# Patient Record
Sex: Male | Born: 1935 | Race: White | Hispanic: No | Marital: Married | Smoking: Former smoker
Health system: Southern US, Community
[De-identification: ages and names within clinical notes are randomized; demographics above are authoritative.]

## PROBLEM LIST (undated history)

## (undated) DIAGNOSIS — F339 Major depressive disorder, recurrent, unspecified: Secondary | ICD-10-CM

## (undated) DIAGNOSIS — N182 Chronic kidney disease, stage 2 (mild): Secondary | ICD-10-CM

## (undated) DIAGNOSIS — G43909 Migraine, unspecified, not intractable, without status migrainosus: Secondary | ICD-10-CM

## (undated) DIAGNOSIS — R7303 Prediabetes: Secondary | ICD-10-CM

## (undated) DIAGNOSIS — F329 Major depressive disorder, single episode, unspecified: Secondary | ICD-10-CM

## (undated) DIAGNOSIS — G4700 Insomnia, unspecified: Secondary | ICD-10-CM

## (undated) DIAGNOSIS — K219 Gastro-esophageal reflux disease without esophagitis: Secondary | ICD-10-CM

## (undated) DIAGNOSIS — R0602 Shortness of breath: Secondary | ICD-10-CM

## (undated) DIAGNOSIS — F32A Depression, unspecified: Secondary | ICD-10-CM

## (undated) DIAGNOSIS — G4730 Sleep apnea, unspecified: Secondary | ICD-10-CM

## (undated) DIAGNOSIS — G5600 Carpal tunnel syndrome, unspecified upper limb: Secondary | ICD-10-CM

## (undated) DIAGNOSIS — E785 Hyperlipidemia, unspecified: Secondary | ICD-10-CM

## (undated) DIAGNOSIS — J45909 Unspecified asthma, uncomplicated: Secondary | ICD-10-CM

## (undated) DIAGNOSIS — I10 Essential (primary) hypertension: Secondary | ICD-10-CM

## (undated) DIAGNOSIS — Z8673 Personal history of transient ischemic attack (TIA), and cerebral infarction without residual deficits: Secondary | ICD-10-CM

## (undated) DIAGNOSIS — N400 Enlarged prostate without lower urinary tract symptoms: Secondary | ICD-10-CM

## (undated) HISTORY — DX: Carpal tunnel syndrome, unspecified upper limb: G56.00

## (undated) HISTORY — PX: REPLACEMENT TOTAL KNEE: SUR1224

## (undated) HISTORY — DX: Essential (primary) hypertension: I10

## (undated) HISTORY — DX: Gastro-esophageal reflux disease without esophagitis: K21.9

## (undated) HISTORY — DX: Prediabetes: R73.03

## (undated) HISTORY — DX: Benign prostatic hyperplasia without lower urinary tract symptoms: N40.0

## (undated) HISTORY — DX: Unspecified asthma, uncomplicated: J45.909

## (undated) HISTORY — DX: Hyperlipidemia, unspecified: E78.5

## (undated) HISTORY — PX: APPENDECTOMY: SHX54

## (undated) HISTORY — PX: ROTATOR CUFF REPAIR: SHX139

## (undated) HISTORY — DX: Shortness of breath: R06.02

## (undated) HISTORY — DX: Sleep apnea, unspecified: G47.30

## (undated) HISTORY — PX: OTHER SURGICAL HISTORY: SHX169

## (undated) HISTORY — DX: Personal history of transient ischemic attack (TIA), and cerebral infarction without residual deficits: Z86.73

## (undated) HISTORY — DX: Chronic kidney disease, stage 2 (mild): N18.2

## (undated) HISTORY — DX: Migraine, unspecified, not intractable, without status migrainosus: G43.909

## (undated) HISTORY — DX: Depression, unspecified: F32.A

## (undated) HISTORY — DX: Insomnia, unspecified: G47.00

## (undated) HISTORY — DX: Major depressive disorder, recurrent, unspecified: F33.9

## (undated) HISTORY — DX: Major depressive disorder, single episode, unspecified: F32.9

## (undated) HISTORY — PX: TOTAL KNEE ARTHROPLASTY: SHX125

---

## 1988-06-21 HISTORY — PX: HEMORROIDECTOMY: SUR656

## 2010-05-18 ENCOUNTER — Encounter
Admission: RE | Admit: 2010-05-18 | Discharge: 2010-06-11 | Payer: Self-pay | Source: Home / Self Care | Attending: Specialist | Admitting: Specialist

## 2012-07-18 ENCOUNTER — Ambulatory Visit: Payer: Medicare Other | Attending: Orthopedic Surgery | Admitting: Physical Therapy

## 2012-07-18 DIAGNOSIS — IMO0001 Reserved for inherently not codable concepts without codable children: Secondary | ICD-10-CM | POA: Insufficient documentation

## 2012-07-18 DIAGNOSIS — Z96659 Presence of unspecified artificial knee joint: Secondary | ICD-10-CM | POA: Insufficient documentation

## 2012-07-18 DIAGNOSIS — M25519 Pain in unspecified shoulder: Secondary | ICD-10-CM | POA: Insufficient documentation

## 2012-07-18 DIAGNOSIS — M25619 Stiffness of unspecified shoulder, not elsewhere classified: Secondary | ICD-10-CM | POA: Insufficient documentation

## 2012-07-20 ENCOUNTER — Ambulatory Visit: Payer: Medicare Other | Admitting: Physical Therapy

## 2012-07-24 ENCOUNTER — Ambulatory Visit: Payer: Medicare Other | Attending: Orthopedic Surgery | Admitting: Physical Therapy

## 2012-07-24 DIAGNOSIS — IMO0001 Reserved for inherently not codable concepts without codable children: Secondary | ICD-10-CM | POA: Insufficient documentation

## 2012-07-24 DIAGNOSIS — M25519 Pain in unspecified shoulder: Secondary | ICD-10-CM | POA: Insufficient documentation

## 2012-07-24 DIAGNOSIS — Z96659 Presence of unspecified artificial knee joint: Secondary | ICD-10-CM | POA: Insufficient documentation

## 2012-07-24 DIAGNOSIS — M25619 Stiffness of unspecified shoulder, not elsewhere classified: Secondary | ICD-10-CM | POA: Insufficient documentation

## 2012-07-28 ENCOUNTER — Ambulatory Visit: Payer: Medicare Other | Admitting: Physical Therapy

## 2012-08-01 ENCOUNTER — Ambulatory Visit: Payer: Medicare Other | Admitting: Physical Therapy

## 2012-08-02 ENCOUNTER — Ambulatory Visit: Payer: Medicare Other | Admitting: Physical Therapy

## 2012-08-03 ENCOUNTER — Ambulatory Visit: Payer: Medicare Other | Admitting: Physical Therapy

## 2012-08-04 ENCOUNTER — Ambulatory Visit: Payer: Medicare Other | Admitting: Physical Therapy

## 2012-08-08 ENCOUNTER — Ambulatory Visit: Payer: Medicare Other | Admitting: Physical Therapy

## 2012-08-10 ENCOUNTER — Ambulatory Visit: Payer: Medicare Other | Admitting: Physical Therapy

## 2012-08-14 ENCOUNTER — Ambulatory Visit: Payer: Medicare Other | Admitting: Physical Therapy

## 2012-08-15 ENCOUNTER — Ambulatory Visit: Payer: Medicare Other | Admitting: Physical Therapy

## 2012-08-17 ENCOUNTER — Ambulatory Visit: Payer: Medicare Other | Admitting: Physical Therapy

## 2012-08-22 ENCOUNTER — Ambulatory Visit: Payer: Medicare Other | Attending: Orthopedic Surgery | Admitting: Physical Therapy

## 2012-08-22 DIAGNOSIS — IMO0001 Reserved for inherently not codable concepts without codable children: Secondary | ICD-10-CM | POA: Insufficient documentation

## 2012-08-22 DIAGNOSIS — M25519 Pain in unspecified shoulder: Secondary | ICD-10-CM | POA: Insufficient documentation

## 2012-08-22 DIAGNOSIS — Z96659 Presence of unspecified artificial knee joint: Secondary | ICD-10-CM | POA: Insufficient documentation

## 2012-08-22 DIAGNOSIS — M25619 Stiffness of unspecified shoulder, not elsewhere classified: Secondary | ICD-10-CM | POA: Insufficient documentation

## 2012-08-24 ENCOUNTER — Ambulatory Visit: Payer: Medicare Other | Admitting: Physical Therapy

## 2012-08-29 ENCOUNTER — Ambulatory Visit: Payer: Medicare Other | Admitting: Physical Therapy

## 2012-09-01 ENCOUNTER — Ambulatory Visit: Payer: Medicare Other | Admitting: Physical Therapy

## 2012-09-05 ENCOUNTER — Ambulatory Visit: Payer: Medicare Other | Admitting: Physical Therapy

## 2012-09-08 ENCOUNTER — Ambulatory Visit: Payer: Medicare Other | Admitting: Physical Therapy

## 2012-09-12 ENCOUNTER — Ambulatory Visit: Payer: Medicare Other | Admitting: Physical Therapy

## 2012-09-14 ENCOUNTER — Ambulatory Visit: Payer: Medicare Other | Admitting: Physical Therapy

## 2012-10-23 HISTORY — PX: ESOPHAGOGASTRODUODENOSCOPY: SHX1529

## 2013-06-21 HISTORY — PX: NASAL SEPTUM SURGERY: SHX37

## 2015-04-29 DIAGNOSIS — Z0000 Encounter for general adult medical examination without abnormal findings: Secondary | ICD-10-CM | POA: Diagnosis not present

## 2015-04-29 DIAGNOSIS — E785 Hyperlipidemia, unspecified: Secondary | ICD-10-CM | POA: Diagnosis not present

## 2015-04-29 DIAGNOSIS — Z125 Encounter for screening for malignant neoplasm of prostate: Secondary | ICD-10-CM | POA: Diagnosis not present

## 2015-04-29 DIAGNOSIS — Z1211 Encounter for screening for malignant neoplasm of colon: Secondary | ICD-10-CM | POA: Diagnosis not present

## 2015-04-29 DIAGNOSIS — Z79899 Other long term (current) drug therapy: Secondary | ICD-10-CM | POA: Diagnosis not present

## 2015-04-29 DIAGNOSIS — N182 Chronic kidney disease, stage 2 (mild): Secondary | ICD-10-CM | POA: Diagnosis not present

## 2015-04-29 DIAGNOSIS — E559 Vitamin D deficiency, unspecified: Secondary | ICD-10-CM | POA: Diagnosis not present

## 2015-04-29 DIAGNOSIS — I1310 Hypertensive heart and chronic kidney disease without heart failure, with stage 1 through stage 4 chronic kidney disease, or unspecified chronic kidney disease: Secondary | ICD-10-CM | POA: Diagnosis not present

## 2015-04-29 DIAGNOSIS — R7309 Other abnormal glucose: Secondary | ICD-10-CM | POA: Diagnosis not present

## 2015-05-09 DIAGNOSIS — N182 Chronic kidney disease, stage 2 (mild): Secondary | ICD-10-CM | POA: Diagnosis not present

## 2015-05-09 DIAGNOSIS — E785 Hyperlipidemia, unspecified: Secondary | ICD-10-CM | POA: Diagnosis not present

## 2015-05-09 DIAGNOSIS — Z125 Encounter for screening for malignant neoplasm of prostate: Secondary | ICD-10-CM | POA: Diagnosis not present

## 2015-05-09 DIAGNOSIS — Z0000 Encounter for general adult medical examination without abnormal findings: Secondary | ICD-10-CM | POA: Diagnosis not present

## 2015-05-09 DIAGNOSIS — Z136 Encounter for screening for cardiovascular disorders: Secondary | ICD-10-CM | POA: Diagnosis not present

## 2015-05-09 DIAGNOSIS — E6609 Other obesity due to excess calories: Secondary | ICD-10-CM | POA: Diagnosis not present

## 2015-05-09 DIAGNOSIS — R0602 Shortness of breath: Secondary | ICD-10-CM | POA: Diagnosis not present

## 2015-05-09 DIAGNOSIS — I1310 Hypertensive heart and chronic kidney disease without heart failure, with stage 1 through stage 4 chronic kidney disease, or unspecified chronic kidney disease: Secondary | ICD-10-CM | POA: Diagnosis not present

## 2015-05-09 DIAGNOSIS — G4700 Insomnia, unspecified: Secondary | ICD-10-CM | POA: Diagnosis not present

## 2015-05-09 DIAGNOSIS — Z1211 Encounter for screening for malignant neoplasm of colon: Secondary | ICD-10-CM | POA: Diagnosis not present

## 2015-05-13 DIAGNOSIS — Z1211 Encounter for screening for malignant neoplasm of colon: Secondary | ICD-10-CM | POA: Diagnosis not present

## 2015-07-03 DIAGNOSIS — H04123 Dry eye syndrome of bilateral lacrimal glands: Secondary | ICD-10-CM | POA: Diagnosis not present

## 2015-07-03 DIAGNOSIS — H2510 Age-related nuclear cataract, unspecified eye: Secondary | ICD-10-CM | POA: Diagnosis not present

## 2015-07-03 DIAGNOSIS — H5203 Hypermetropia, bilateral: Secondary | ICD-10-CM | POA: Diagnosis not present

## 2015-07-10 DIAGNOSIS — K921 Melena: Secondary | ICD-10-CM | POA: Diagnosis not present

## 2015-08-04 DIAGNOSIS — K921 Melena: Secondary | ICD-10-CM | POA: Diagnosis not present

## 2015-08-04 DIAGNOSIS — K644 Residual hemorrhoidal skin tags: Secondary | ICD-10-CM | POA: Diagnosis not present

## 2015-08-04 DIAGNOSIS — K648 Other hemorrhoids: Secondary | ICD-10-CM | POA: Diagnosis not present

## 2015-08-04 DIAGNOSIS — G4700 Insomnia, unspecified: Secondary | ICD-10-CM | POA: Diagnosis not present

## 2015-08-04 DIAGNOSIS — E785 Hyperlipidemia, unspecified: Secondary | ICD-10-CM | POA: Diagnosis not present

## 2015-08-04 DIAGNOSIS — K219 Gastro-esophageal reflux disease without esophagitis: Secondary | ICD-10-CM | POA: Diagnosis not present

## 2015-08-04 DIAGNOSIS — K5730 Diverticulosis of large intestine without perforation or abscess without bleeding: Secondary | ICD-10-CM | POA: Diagnosis not present

## 2015-08-04 DIAGNOSIS — J449 Chronic obstructive pulmonary disease, unspecified: Secondary | ICD-10-CM | POA: Diagnosis not present

## 2015-08-04 DIAGNOSIS — N400 Enlarged prostate without lower urinary tract symptoms: Secondary | ICD-10-CM | POA: Diagnosis not present

## 2015-08-04 DIAGNOSIS — I10 Essential (primary) hypertension: Secondary | ICD-10-CM | POA: Diagnosis not present

## 2015-08-04 HISTORY — PX: COLONOSCOPY: SHX174

## 2015-08-28 DIAGNOSIS — M545 Low back pain: Secondary | ICD-10-CM | POA: Diagnosis not present

## 2015-08-28 DIAGNOSIS — R05 Cough: Secondary | ICD-10-CM | POA: Diagnosis not present

## 2015-09-08 DIAGNOSIS — M5489 Other dorsalgia: Secondary | ICD-10-CM | POA: Diagnosis not present

## 2015-09-08 DIAGNOSIS — M5432 Sciatica, left side: Secondary | ICD-10-CM | POA: Diagnosis not present

## 2015-09-11 DIAGNOSIS — M5489 Other dorsalgia: Secondary | ICD-10-CM | POA: Diagnosis not present

## 2015-09-11 DIAGNOSIS — M5432 Sciatica, left side: Secondary | ICD-10-CM | POA: Diagnosis not present

## 2015-09-17 DIAGNOSIS — M5489 Other dorsalgia: Secondary | ICD-10-CM | POA: Diagnosis not present

## 2015-09-17 DIAGNOSIS — M5432 Sciatica, left side: Secondary | ICD-10-CM | POA: Diagnosis not present

## 2015-09-19 DIAGNOSIS — M5432 Sciatica, left side: Secondary | ICD-10-CM | POA: Diagnosis not present

## 2015-09-19 DIAGNOSIS — M5489 Other dorsalgia: Secondary | ICD-10-CM | POA: Diagnosis not present

## 2015-11-20 DIAGNOSIS — M722 Plantar fascial fibromatosis: Secondary | ICD-10-CM | POA: Diagnosis not present

## 2015-11-20 DIAGNOSIS — N182 Chronic kidney disease, stage 2 (mild): Secondary | ICD-10-CM | POA: Diagnosis not present

## 2015-11-20 DIAGNOSIS — I1310 Hypertensive heart and chronic kidney disease without heart failure, with stage 1 through stage 4 chronic kidney disease, or unspecified chronic kidney disease: Secondary | ICD-10-CM | POA: Diagnosis not present

## 2015-11-20 DIAGNOSIS — K210 Gastro-esophageal reflux disease with esophagitis: Secondary | ICD-10-CM | POA: Diagnosis not present

## 2015-11-20 DIAGNOSIS — G4700 Insomnia, unspecified: Secondary | ICD-10-CM | POA: Diagnosis not present

## 2015-11-20 DIAGNOSIS — E669 Obesity, unspecified: Secondary | ICD-10-CM | POA: Diagnosis not present

## 2015-11-20 DIAGNOSIS — Z9181 History of falling: Secondary | ICD-10-CM | POA: Diagnosis not present

## 2015-11-20 DIAGNOSIS — Z1389 Encounter for screening for other disorder: Secondary | ICD-10-CM | POA: Diagnosis not present

## 2015-11-20 DIAGNOSIS — R7303 Prediabetes: Secondary | ICD-10-CM | POA: Diagnosis not present

## 2015-11-20 DIAGNOSIS — M79662 Pain in left lower leg: Secondary | ICD-10-CM | POA: Diagnosis not present

## 2015-11-20 DIAGNOSIS — E785 Hyperlipidemia, unspecified: Secondary | ICD-10-CM | POA: Diagnosis not present

## 2015-11-25 DIAGNOSIS — R69 Illness, unspecified: Secondary | ICD-10-CM | POA: Diagnosis not present

## 2015-11-25 DIAGNOSIS — K219 Gastro-esophageal reflux disease without esophagitis: Secondary | ICD-10-CM | POA: Diagnosis not present

## 2015-11-25 DIAGNOSIS — R05 Cough: Secondary | ICD-10-CM | POA: Diagnosis not present

## 2015-11-25 DIAGNOSIS — J45991 Cough variant asthma: Secondary | ICD-10-CM | POA: Diagnosis not present

## 2015-12-01 DIAGNOSIS — E559 Vitamin D deficiency, unspecified: Secondary | ICD-10-CM | POA: Diagnosis not present

## 2015-12-16 DIAGNOSIS — J45991 Cough variant asthma: Secondary | ICD-10-CM | POA: Diagnosis not present

## 2015-12-16 DIAGNOSIS — K219 Gastro-esophageal reflux disease without esophagitis: Secondary | ICD-10-CM | POA: Diagnosis not present

## 2015-12-16 DIAGNOSIS — R05 Cough: Secondary | ICD-10-CM | POA: Diagnosis not present

## 2016-01-05 DIAGNOSIS — R05 Cough: Secondary | ICD-10-CM | POA: Diagnosis not present

## 2016-01-05 DIAGNOSIS — R918 Other nonspecific abnormal finding of lung field: Secondary | ICD-10-CM | POA: Diagnosis not present

## 2016-01-05 DIAGNOSIS — J45991 Cough variant asthma: Secondary | ICD-10-CM | POA: Diagnosis not present

## 2016-01-08 DIAGNOSIS — J479 Bronchiectasis, uncomplicated: Secondary | ICD-10-CM | POA: Diagnosis not present

## 2016-01-08 DIAGNOSIS — R05 Cough: Secondary | ICD-10-CM | POA: Diagnosis not present

## 2016-01-08 DIAGNOSIS — K219 Gastro-esophageal reflux disease without esophagitis: Secondary | ICD-10-CM | POA: Diagnosis not present

## 2016-01-08 DIAGNOSIS — J45991 Cough variant asthma: Secondary | ICD-10-CM | POA: Diagnosis not present

## 2016-02-09 DIAGNOSIS — N182 Chronic kidney disease, stage 2 (mild): Secondary | ICD-10-CM | POA: Diagnosis not present

## 2016-02-09 DIAGNOSIS — R05 Cough: Secondary | ICD-10-CM | POA: Diagnosis not present

## 2016-02-09 DIAGNOSIS — J4520 Mild intermittent asthma, uncomplicated: Secondary | ICD-10-CM | POA: Diagnosis not present

## 2016-02-09 DIAGNOSIS — I1310 Hypertensive heart and chronic kidney disease without heart failure, with stage 1 through stage 4 chronic kidney disease, or unspecified chronic kidney disease: Secondary | ICD-10-CM | POA: Diagnosis not present

## 2016-02-09 DIAGNOSIS — R0602 Shortness of breath: Secondary | ICD-10-CM | POA: Diagnosis not present

## 2016-02-09 DIAGNOSIS — R5383 Other fatigue: Secondary | ICD-10-CM | POA: Diagnosis not present

## 2016-02-20 DIAGNOSIS — R69 Illness, unspecified: Secondary | ICD-10-CM | POA: Diagnosis not present

## 2016-02-25 DIAGNOSIS — I1310 Hypertensive heart and chronic kidney disease without heart failure, with stage 1 through stage 4 chronic kidney disease, or unspecified chronic kidney disease: Secondary | ICD-10-CM | POA: Diagnosis not present

## 2016-02-25 DIAGNOSIS — R05 Cough: Secondary | ICD-10-CM | POA: Diagnosis not present

## 2016-02-25 DIAGNOSIS — N182 Chronic kidney disease, stage 2 (mild): Secondary | ICD-10-CM | POA: Diagnosis not present

## 2016-02-25 DIAGNOSIS — D487 Neoplasm of uncertain behavior of other specified sites: Secondary | ICD-10-CM | POA: Diagnosis not present

## 2016-02-25 DIAGNOSIS — Z79899 Other long term (current) drug therapy: Secondary | ICD-10-CM | POA: Diagnosis not present

## 2016-03-02 DIAGNOSIS — M4806 Spinal stenosis, lumbar region: Secondary | ICD-10-CM | POA: Diagnosis not present

## 2016-03-03 DIAGNOSIS — D485 Neoplasm of uncertain behavior of skin: Secondary | ICD-10-CM | POA: Diagnosis not present

## 2016-03-10 DIAGNOSIS — M4806 Spinal stenosis, lumbar region: Secondary | ICD-10-CM | POA: Diagnosis not present

## 2016-03-10 DIAGNOSIS — M5136 Other intervertebral disc degeneration, lumbar region: Secondary | ICD-10-CM | POA: Diagnosis not present

## 2016-03-10 DIAGNOSIS — M545 Low back pain: Secondary | ICD-10-CM | POA: Diagnosis not present

## 2016-03-12 DIAGNOSIS — M4806 Spinal stenosis, lumbar region: Secondary | ICD-10-CM | POA: Diagnosis not present

## 2016-03-19 DIAGNOSIS — Z23 Encounter for immunization: Secondary | ICD-10-CM | POA: Diagnosis not present

## 2016-03-22 DIAGNOSIS — M47817 Spondylosis without myelopathy or radiculopathy, lumbosacral region: Secondary | ICD-10-CM | POA: Diagnosis not present

## 2016-03-22 DIAGNOSIS — M48061 Spinal stenosis, lumbar region without neurogenic claudication: Secondary | ICD-10-CM | POA: Diagnosis not present

## 2016-04-02 DIAGNOSIS — M1611 Unilateral primary osteoarthritis, right hip: Secondary | ICD-10-CM | POA: Diagnosis not present

## 2016-04-08 DIAGNOSIS — Z01818 Encounter for other preprocedural examination: Secondary | ICD-10-CM | POA: Diagnosis not present

## 2016-04-08 DIAGNOSIS — Z01810 Encounter for preprocedural cardiovascular examination: Secondary | ICD-10-CM | POA: Diagnosis not present

## 2016-04-08 DIAGNOSIS — M79609 Pain in unspecified limb: Secondary | ICD-10-CM | POA: Diagnosis not present

## 2016-04-08 DIAGNOSIS — R52 Pain, unspecified: Secondary | ICD-10-CM | POA: Diagnosis not present

## 2016-04-08 DIAGNOSIS — Z79899 Other long term (current) drug therapy: Secondary | ICD-10-CM | POA: Diagnosis not present

## 2016-04-08 DIAGNOSIS — E559 Vitamin D deficiency, unspecified: Secondary | ICD-10-CM | POA: Diagnosis not present

## 2016-04-14 DIAGNOSIS — I1310 Hypertensive heart and chronic kidney disease without heart failure, with stage 1 through stage 4 chronic kidney disease, or unspecified chronic kidney disease: Secondary | ICD-10-CM | POA: Diagnosis not present

## 2016-04-14 DIAGNOSIS — J4520 Mild intermittent asthma, uncomplicated: Secondary | ICD-10-CM | POA: Diagnosis not present

## 2016-04-14 DIAGNOSIS — N182 Chronic kidney disease, stage 2 (mild): Secondary | ICD-10-CM | POA: Diagnosis not present

## 2016-04-14 DIAGNOSIS — R7303 Prediabetes: Secondary | ICD-10-CM | POA: Diagnosis not present

## 2016-04-14 DIAGNOSIS — M1611 Unilateral primary osteoarthritis, right hip: Secondary | ICD-10-CM | POA: Diagnosis not present

## 2016-04-14 DIAGNOSIS — Z01818 Encounter for other preprocedural examination: Secondary | ICD-10-CM | POA: Diagnosis not present

## 2016-04-20 DIAGNOSIS — Z01818 Encounter for other preprocedural examination: Secondary | ICD-10-CM | POA: Diagnosis not present

## 2016-04-20 DIAGNOSIS — M1611 Unilateral primary osteoarthritis, right hip: Secondary | ICD-10-CM | POA: Diagnosis not present

## 2016-04-22 DIAGNOSIS — E785 Hyperlipidemia, unspecified: Secondary | ICD-10-CM | POA: Diagnosis not present

## 2016-04-22 DIAGNOSIS — Z882 Allergy status to sulfonamides status: Secondary | ICD-10-CM | POA: Diagnosis not present

## 2016-04-22 DIAGNOSIS — Z881 Allergy status to other antibiotic agents status: Secondary | ICD-10-CM | POA: Diagnosis not present

## 2016-04-22 DIAGNOSIS — M1611 Unilateral primary osteoarthritis, right hip: Secondary | ICD-10-CM | POA: Diagnosis not present

## 2016-04-22 DIAGNOSIS — E559 Vitamin D deficiency, unspecified: Secondary | ICD-10-CM | POA: Diagnosis not present

## 2016-04-22 DIAGNOSIS — I1310 Hypertensive heart and chronic kidney disease without heart failure, with stage 1 through stage 4 chronic kidney disease, or unspecified chronic kidney disease: Secondary | ICD-10-CM | POA: Diagnosis not present

## 2016-04-22 DIAGNOSIS — J4520 Mild intermittent asthma, uncomplicated: Secondary | ICD-10-CM | POA: Diagnosis not present

## 2016-04-22 DIAGNOSIS — Z91041 Radiographic dye allergy status: Secondary | ICD-10-CM | POA: Diagnosis not present

## 2016-04-22 DIAGNOSIS — R7303 Prediabetes: Secondary | ICD-10-CM | POA: Diagnosis not present

## 2016-04-22 DIAGNOSIS — E669 Obesity, unspecified: Secondary | ICD-10-CM | POA: Diagnosis not present

## 2016-04-22 DIAGNOSIS — J45909 Unspecified asthma, uncomplicated: Secondary | ICD-10-CM | POA: Diagnosis not present

## 2016-04-22 DIAGNOSIS — J449 Chronic obstructive pulmonary disease, unspecified: Secondary | ICD-10-CM | POA: Diagnosis not present

## 2016-04-25 DIAGNOSIS — Z471 Aftercare following joint replacement surgery: Secondary | ICD-10-CM | POA: Diagnosis not present

## 2016-04-26 DIAGNOSIS — Z471 Aftercare following joint replacement surgery: Secondary | ICD-10-CM | POA: Diagnosis not present

## 2016-04-28 DIAGNOSIS — Z471 Aftercare following joint replacement surgery: Secondary | ICD-10-CM | POA: Diagnosis not present

## 2016-04-29 DIAGNOSIS — Z471 Aftercare following joint replacement surgery: Secondary | ICD-10-CM | POA: Diagnosis not present

## 2016-05-03 DIAGNOSIS — Z471 Aftercare following joint replacement surgery: Secondary | ICD-10-CM | POA: Diagnosis not present

## 2016-05-05 DIAGNOSIS — Z471 Aftercare following joint replacement surgery: Secondary | ICD-10-CM | POA: Diagnosis not present

## 2016-05-07 DIAGNOSIS — Z471 Aftercare following joint replacement surgery: Secondary | ICD-10-CM | POA: Diagnosis not present

## 2016-05-10 DIAGNOSIS — Z471 Aftercare following joint replacement surgery: Secondary | ICD-10-CM | POA: Diagnosis not present

## 2016-05-11 DIAGNOSIS — Z1212 Encounter for screening for malignant neoplasm of rectum: Secondary | ICD-10-CM | POA: Diagnosis not present

## 2016-05-11 DIAGNOSIS — Z125 Encounter for screening for malignant neoplasm of prostate: Secondary | ICD-10-CM | POA: Diagnosis not present

## 2016-05-11 DIAGNOSIS — E785 Hyperlipidemia, unspecified: Secondary | ICD-10-CM | POA: Diagnosis not present

## 2016-05-11 DIAGNOSIS — Z79899 Other long term (current) drug therapy: Secondary | ICD-10-CM | POA: Diagnosis not present

## 2016-05-11 DIAGNOSIS — Z1389 Encounter for screening for other disorder: Secondary | ICD-10-CM | POA: Diagnosis not present

## 2016-05-11 DIAGNOSIS — Z1211 Encounter for screening for malignant neoplasm of colon: Secondary | ICD-10-CM | POA: Diagnosis not present

## 2016-05-11 DIAGNOSIS — Z0000 Encounter for general adult medical examination without abnormal findings: Secondary | ICD-10-CM | POA: Diagnosis not present

## 2016-05-11 DIAGNOSIS — N182 Chronic kidney disease, stage 2 (mild): Secondary | ICD-10-CM | POA: Diagnosis not present

## 2016-05-11 DIAGNOSIS — I1310 Hypertensive heart and chronic kidney disease without heart failure, with stage 1 through stage 4 chronic kidney disease, or unspecified chronic kidney disease: Secondary | ICD-10-CM | POA: Diagnosis not present

## 2016-05-11 DIAGNOSIS — E559 Vitamin D deficiency, unspecified: Secondary | ICD-10-CM | POA: Diagnosis not present

## 2016-05-11 DIAGNOSIS — R7303 Prediabetes: Secondary | ICD-10-CM | POA: Diagnosis not present

## 2016-05-12 DIAGNOSIS — Z471 Aftercare following joint replacement surgery: Secondary | ICD-10-CM | POA: Diagnosis not present

## 2016-05-18 DIAGNOSIS — E785 Hyperlipidemia, unspecified: Secondary | ICD-10-CM | POA: Diagnosis not present

## 2016-05-24 DIAGNOSIS — D485 Neoplasm of uncertain behavior of skin: Secondary | ICD-10-CM | POA: Diagnosis not present

## 2016-06-09 DIAGNOSIS — M1611 Unilateral primary osteoarthritis, right hip: Secondary | ICD-10-CM | POA: Diagnosis not present

## 2016-06-15 DIAGNOSIS — Z136 Encounter for screening for cardiovascular disorders: Secondary | ICD-10-CM | POA: Diagnosis not present

## 2016-06-15 DIAGNOSIS — Z9181 History of falling: Secondary | ICD-10-CM | POA: Diagnosis not present

## 2016-06-15 DIAGNOSIS — Z0000 Encounter for general adult medical examination without abnormal findings: Secondary | ICD-10-CM | POA: Diagnosis not present

## 2016-06-15 DIAGNOSIS — E6609 Other obesity due to excess calories: Secondary | ICD-10-CM | POA: Diagnosis not present

## 2016-06-15 DIAGNOSIS — Z6831 Body mass index (BMI) 31.0-31.9, adult: Secondary | ICD-10-CM | POA: Diagnosis not present

## 2016-06-15 DIAGNOSIS — E785 Hyperlipidemia, unspecified: Secondary | ICD-10-CM | POA: Diagnosis not present

## 2016-06-15 DIAGNOSIS — E669 Obesity, unspecified: Secondary | ICD-10-CM | POA: Diagnosis not present

## 2016-09-08 DIAGNOSIS — K219 Gastro-esophageal reflux disease without esophagitis: Secondary | ICD-10-CM | POA: Diagnosis not present

## 2016-09-08 DIAGNOSIS — N182 Chronic kidney disease, stage 2 (mild): Secondary | ICD-10-CM | POA: Diagnosis not present

## 2016-09-08 DIAGNOSIS — R7303 Prediabetes: Secondary | ICD-10-CM | POA: Diagnosis not present

## 2016-09-08 DIAGNOSIS — E785 Hyperlipidemia, unspecified: Secondary | ICD-10-CM | POA: Diagnosis not present

## 2016-09-08 DIAGNOSIS — R0602 Shortness of breath: Secondary | ICD-10-CM | POA: Diagnosis not present

## 2016-09-08 DIAGNOSIS — Z79899 Other long term (current) drug therapy: Secondary | ICD-10-CM | POA: Diagnosis not present

## 2016-09-08 DIAGNOSIS — L309 Dermatitis, unspecified: Secondary | ICD-10-CM | POA: Diagnosis not present

## 2016-09-08 DIAGNOSIS — M545 Low back pain: Secondary | ICD-10-CM | POA: Diagnosis not present

## 2016-09-08 DIAGNOSIS — Z87898 Personal history of other specified conditions: Secondary | ICD-10-CM | POA: Diagnosis not present

## 2016-09-08 DIAGNOSIS — I1310 Hypertensive heart and chronic kidney disease without heart failure, with stage 1 through stage 4 chronic kidney disease, or unspecified chronic kidney disease: Secondary | ICD-10-CM | POA: Diagnosis not present

## 2016-09-16 DIAGNOSIS — M1611 Unilateral primary osteoarthritis, right hip: Secondary | ICD-10-CM | POA: Diagnosis not present

## 2016-09-22 DIAGNOSIS — J4520 Mild intermittent asthma, uncomplicated: Secondary | ICD-10-CM | POA: Diagnosis not present

## 2016-12-27 DIAGNOSIS — G43109 Migraine with aura, not intractable, without status migrainosus: Secondary | ICD-10-CM | POA: Diagnosis not present

## 2017-01-19 DIAGNOSIS — Z79899 Other long term (current) drug therapy: Secondary | ICD-10-CM | POA: Diagnosis not present

## 2017-01-19 DIAGNOSIS — R7303 Prediabetes: Secondary | ICD-10-CM | POA: Diagnosis not present

## 2017-01-19 DIAGNOSIS — I1310 Hypertensive heart and chronic kidney disease without heart failure, with stage 1 through stage 4 chronic kidney disease, or unspecified chronic kidney disease: Secondary | ICD-10-CM | POA: Diagnosis not present

## 2017-01-19 DIAGNOSIS — G4700 Insomnia, unspecified: Secondary | ICD-10-CM | POA: Diagnosis not present

## 2017-01-19 DIAGNOSIS — K210 Gastro-esophageal reflux disease with esophagitis: Secondary | ICD-10-CM | POA: Diagnosis not present

## 2017-01-19 DIAGNOSIS — E785 Hyperlipidemia, unspecified: Secondary | ICD-10-CM | POA: Diagnosis not present

## 2017-01-19 DIAGNOSIS — R69 Illness, unspecified: Secondary | ICD-10-CM | POA: Diagnosis not present

## 2017-01-19 DIAGNOSIS — N182 Chronic kidney disease, stage 2 (mild): Secondary | ICD-10-CM | POA: Diagnosis not present

## 2017-01-19 DIAGNOSIS — G43109 Migraine with aura, not intractable, without status migrainosus: Secondary | ICD-10-CM | POA: Diagnosis not present

## 2017-03-24 ENCOUNTER — Ambulatory Visit (HOSPITAL_BASED_OUTPATIENT_CLINIC_OR_DEPARTMENT_OTHER)
Admission: RE | Admit: 2017-03-24 | Discharge: 2017-03-24 | Disposition: A | Discharge status: Home / Self Care | Payer: Medicare HMO | Source: Ambulatory Visit | Attending: Cardiology | Admitting: Cardiology

## 2017-03-24 ENCOUNTER — Ambulatory Visit (INDEPENDENT_AMBULATORY_CARE_PROVIDER_SITE_OTHER): Payer: Medicare HMO | Admitting: Cardiology

## 2017-03-24 ENCOUNTER — Encounter: Payer: Self-pay | Admitting: Cardiology

## 2017-03-24 DIAGNOSIS — I517 Cardiomegaly: Secondary | ICD-10-CM | POA: Diagnosis not present

## 2017-03-24 DIAGNOSIS — R0609 Other forms of dyspnea: Secondary | ICD-10-CM | POA: Insufficient documentation

## 2017-03-24 DIAGNOSIS — R0600 Dyspnea, unspecified: Secondary | ICD-10-CM | POA: Insufficient documentation

## 2017-03-24 DIAGNOSIS — I10 Essential (primary) hypertension: Secondary | ICD-10-CM

## 2017-03-24 DIAGNOSIS — R0602 Shortness of breath: Secondary | ICD-10-CM | POA: Insufficient documentation

## 2017-03-24 MED ORDER — FUROSEMIDE 20 MG PO TABS: 20.0000 mg | ORAL_TABLET | Freq: Every day | ORAL | 3 refills | Status: DC

## 2017-03-24 NOTE — Patient Instructions (Addendum)
Medication Instructions:  Your physician has recommended you make the following change in your medication: Start Furosemide 20 mg daily, STOP Doxazsin.  Labwork: Your physician recommends that you return for lab work in: BNP and D-Dimer  Testing/Procedures: Your physician has requested that you have an echocardiogram. Echocardiography is a painless test that uses sound waves to create images of your heart. It provides your doctor with information about the size and shape of your heart and how well your heart's chambers and valves are working. This procedure takes approximately one hour. There are no restrictions for this procedure.  Follow-Up: Your physician recommends that you schedule a follow-up appointment in: 2 weeks   Any Other Special Instructions Will Be Listed Below (If Applicable).  Heart Failure  Weigh yourself every morning when you first wake up and record on a calender or note pad, bring this to your office visits. Using a pill tender can help with taking your medications consistently.  Limit your fluid intake to 2 liters daily  Limit your sodium intake to less than 2-3 grams daily. Ask if you need dietary teaching.  If you gain more than 3 pounds (from your dry weight ), double your dose of diuretic for the day.  If you gain more than 5 pounds (from your dry weight), double your dose of lasix and call your heart failure doctor.  Please do not smoke tobacco since it is very bad for your heart.  Please do not drink alcohol since it can worsen your heart failure.Also avoid OTC nonsteroidal drugs, such as advil, aleve and motrin.  Try to exercise for at least 30 minutes every day because this will help your heart be more efficient. You may be eligible for supervised cardiac rehab, ask your physician.  If you need a refill on your cardiac medications before your next appointment, please call your pharmacy.

## 2017-03-24 NOTE — Progress Notes (Signed)
Cardiology Office Note:    Date:  03/24/2017   ID:  Scott Singh, DOB 1936-04-03, MRN [id number]  PCP:  Melony Overly, MD  Cardiologist:  Shirlee More, MD   Referring MD: Melony Overly, MD  ASSESSMENT:    1. SOB (shortness of breath)   2. Essential hypertension    PLAN:    In order of problems listed above:  1. His differential diagnosis includes cardiac especially heart failure with neck vein distention and edema as well as pulmonary etiologies with this exposure to asbestos being a previous cigarette smoker as well as venous thromboembolism. I started him on small dose of loop diuretic. He will sodium restrict weight daily. For further evaluation BNP level for heart failure d-dimer, if normal no further evaluation for pulmonary thromboembolism echocardiogram to assess pulmonary pressures and left and right ventricular function and a chest x-ray for chronic lung disease or fibrosis. 2. Stable, I stopped his alpha blocker that is associated with a higher incidence of heart failure. If heart failure is confirmed I will stop his calcium channel blocker and switch ARB.  Next appointment 2 weeks   Medication Adjustments/Labs and Tests Ordered: Current medicines are reviewed at length with the patient today.  Concerns regarding medicines are outlined above.  Orders Placed This Encounter  Procedures  . DG Chest 2 View  . D-Dimer, Quantitative  . B Nat Peptide  . ECHOCARDIOGRAM COMPLETE   Meds ordered this encounter  Medications  . furosemide (LASIX) 20 MG tablet    Sig: Take 1 tablet (20 mg total) by mouth daily.    Dispense:  90 tablet    Refill:  3     Chief Complaint  Patient presents with  . Shortness of Breath    History of Present Illness:    Scott Singh is a 81 y.o. male who is being seen today for the evaluation of SOB self referred.he has a pattern of exertional shortness of breath for several years that is worsened recently. His recent point that when he  walks back from a shop in the evening uphill less than 50 feet he developed severe shortness of breath that forces him to stop take several minutes to catch his breath and then up to an hour to recover his strength. He has noticed peripheral edema is on both an alpha blocker and a calcium channel blocker for hypertension but has had no orthopnea PND chest pain palpitation or syncope. He also has a chronic cough at times paroxysms of cough. He is at risk for venous thromboembolism with total hip arthroplasty spring of this year. He's had no pleuritic chest pain. 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 He thinks he had a chest x-ray done prior to his hip surgery and is unaware of any abnormality. His wife has a history of cardiomyopathy and heart failure and is concerned that he has the same problem    Past Medical History:  Diagnosis Date  . Asthma   . BPH (benign prostatic hyperplasia)   . CKD (chronic kidney disease) stage 2, GFR 60-89 ml/min   . GERD (gastroesophageal reflux disease)   . Hyperlipidemia   . Hypertension   . Migraine   . Migraines   . Prediabetes   . Recurrent major depression ([place])   . Sleep apnea     Past Surgical History:  Procedure Laterality Date  . APPENDECTOMY    .  APPENDECTOMY    . NASAL SEPTUM SURGERY    . REPLACEMENT TOTAL KNEE    . ROTATOR CUFF REPAIR    . shoul surg    . TOTAL HIP ARTHROPLASTY    . TOTAL KNEE ARTHROPLASTY      Current Medications: Current Meds  Medication Sig  . amLODipine (NORVASC) 5 MG tablet Take 5 mg by mouth daily.  Marland Kitchen aspirin EC 81 MG tablet Take 81 mg by mouth daily.  . bisoprolol (ZEBETA) 10 MG tablet Take 2 tablets by mouth daily.  Marland Kitchen ibuprofen (ADVIL,MOTRIN) 600 MG tablet Take 1 tablet by mouth 3 (three) times daily.  . montelukast (SINGULAIR) 10 MG tablet Take 10 mg by mouth daily.  . Multiple Vitamin (MULTIVITAMIN) capsule Take 1 capsule by mouth daily.    . niacin 500 MG tablet Take 500 mg by mouth at bedtime.  . Omega-3 Fatty Acids (FISH OIL) 1200 MG CAPS Take 2 capsules by mouth 2 (two) times daily.  Marland Kitchen omeprazole (PRILOSEC) 40 MG capsule Take 1 capsule by mouth daily.  . pravastatin (PRAVACHOL) 40 MG tablet Take 40 mg by mouth daily.  Marland Kitchen pyridoxine (B-6) 100 MG tablet Take 100 mg by mouth daily.  . ranitidine (ZANTAC) 150 MG capsule Take 150 mg by mouth every evening.  . topiramate (TOPAMAX) 25 MG tablet TAKE 2 TABLETS BY MOUTH IN THE MORNING AND 1 IN THE AFTERNOON  . Vitamin D, Ergocalciferol, (DRISDOL) 50000 units CAPS capsule Take 50,000 Units by mouth once a week.  . vitamin E 400 UNIT capsule Take 400 Units by mouth daily.  . [DISCONTINUED] doxazosin (CARDURA) 4 MG tablet Take 1 tablet by mouth daily.     Allergies:   Contrast media [iodinated diagnostic agents]; Erythromycin; Sulpho-lac medicated soap [sulfur]; and Tetracyclines & related   Social History   Social History  . Marital status: Married    Spouse name: N/A  . Number of children: N/A  . Years of education: N/A   Social History Main Topics  . Smoking status: Former Research scientist (life sciences)  . Smokeless tobacco: Never Used  . Alcohol use No  . Drug use: No  . Sexual activity: Not Asked   Other Topics Concern  . None   Social History Narrative  . None     Family History: The patient's family history includes Diabetes in his father; Heart disease in his father and sister; Other in his mother.  ROS:   ROS Please see the history of present illness.   He has chronic joint pain  All other systems reviewed and are negative.  EKGs/Labs/Other Studies Reviewed:    The following studies were reviewed today:   Recent Labs: CMP normal Cr 1.0 No results found for requested labs within last 8760 hours.  Recent Lipid Panel Chol 169, LDL 107, HDL 30 No results found for: CHOL, TRIG, HDL, CHOLHDL, VLDL, LDLCALC, LDLDIRECT  Physical Exam:    VS:  BP 104/60   Pulse 68   Resp 14    Ht 5\' 3"  (1.6 m)   Wt 188 lb 1.9 oz (85.3 kg)   BMI 33.32 kg/m     Wt Readings from Last 3 Encounters:  03/24/17 188 lb 1.9 oz (85.3 kg)     GEN:  Well nourished, well developed in no acute distress HEENT: Normal NECK:mild JVD; No carotid bruits LYMPHATICS: No lymphadenopathy CARDIAC: RRR, no murmurs, rubs, gallops RESPIRATORY:  Hyperinflated full breath sounds crackling rales at the bases bilaterally no wheezing ABDOMEN: Soft, non-tender, non-distended MUSCULOSKELETAL:  2-3+ edema to the knees bilaterally ; No deformity  SKIN: Warm and dry NEUROLOGIC:  Alert and oriented x 3 PSYCHIATRIC:  Normal affect     Signed, Shirlee More, MD  03/24/2017 5:12 PM    [facility]

## 2017-03-25 ENCOUNTER — Telehealth: Payer: Self-pay

## 2017-03-25 DIAGNOSIS — R7989 Other specified abnormal findings of blood chemistry: Secondary | ICD-10-CM

## 2017-03-25 DIAGNOSIS — Z23 Encounter for immunization: Secondary | ICD-10-CM | POA: Diagnosis not present

## 2017-03-25 DIAGNOSIS — R0602 Shortness of breath: Secondary | ICD-10-CM

## 2017-03-25 LAB — SPECIMEN STATUS REPORT

## 2017-03-25 NOTE — Telephone Encounter (Signed)
-----   Message from Richardo Priest, MD sent at 03/25/2017  8:08 AM EDT ----- Did we cancel/ Were these done, if not do today at [facility] in [place]

## 2017-03-25 NOTE — Telephone Encounter (Signed)
Pt advised of repeat labs due to [facility] error.  Pt will recheck labs in [place] at [facility].

## 2017-03-26 LAB — D-DIMER, QUANTITATIVE (NOT AT ARMC): D-DIMER: 1 mg{FEU}/L — AB (ref 0.00–0.49)

## 2017-03-26 LAB — BRAIN NATRIURETIC PEPTIDE: BNP: 88.5 pg/mL (ref 0.0–100.0)

## 2017-03-27 ENCOUNTER — Encounter: Payer: Self-pay | Admitting: Cardiology

## 2017-03-28 ENCOUNTER — Telehealth: Payer: Self-pay

## 2017-03-28 DIAGNOSIS — R0602 Shortness of breath: Secondary | ICD-10-CM | POA: Diagnosis not present

## 2017-03-28 DIAGNOSIS — I10 Essential (primary) hypertension: Secondary | ICD-10-CM | POA: Diagnosis not present

## 2017-03-28 LAB — D-DIMER, QUANTITATIVE (NOT AT ARMC)

## 2017-03-28 LAB — BRAIN NATRIURETIC PEPTIDE

## 2017-03-28 MED ORDER — PREDNISONE 50 MG PO TABS: ORAL_TABLET | ORAL | 0 refills | Status: DC

## 2017-03-28 NOTE — Addendum Note (Signed)
Addended by: Jossie Ng on: 03/28/2017 08:17 AM   Modules accepted: Orders

## 2017-03-28 NOTE — Telephone Encounter (Signed)
Patient's wife, Katharine Look, advised of appointment date and time tomorrow, 03/29/17 at 11 am. Advised to take prednisone 50 mg at 10 pm tonight, 3 am tomorrow morning, and 10 am tomorrow morning. Advised to take Benadryl 50 mg tomorrow morning at 10 am. Wife verbalized understanding of medication instructions. Advised to go to the [facility] in [place] today for a STAT BMP to check kidney function prior to CTA. Wife verbalized understanding and will inform the patient of all instructions. No further questions.

## 2017-03-28 NOTE — Telephone Encounter (Signed)
Patient informed of results. Will return call with appointment date and time for CTA chest once scheduled.

## 2017-03-29 ENCOUNTER — Ambulatory Visit (HOSPITAL_BASED_OUTPATIENT_CLINIC_OR_DEPARTMENT_OTHER)
Admission: RE | Admit: 2017-03-29 | Discharge: 2017-03-29 | Disposition: A | Discharge status: Home / Self Care | Payer: Medicare HMO | Source: Ambulatory Visit | Attending: Cardiology | Admitting: Cardiology

## 2017-03-29 ENCOUNTER — Encounter (HOSPITAL_BASED_OUTPATIENT_CLINIC_OR_DEPARTMENT_OTHER): Payer: Self-pay

## 2017-03-29 DIAGNOSIS — I700 Atherosclerosis of aorta: Secondary | ICD-10-CM | POA: Insufficient documentation

## 2017-03-29 DIAGNOSIS — I517 Cardiomegaly: Secondary | ICD-10-CM | POA: Insufficient documentation

## 2017-03-29 DIAGNOSIS — R0602 Shortness of breath: Secondary | ICD-10-CM | POA: Diagnosis present

## 2017-03-29 DIAGNOSIS — R7989 Other specified abnormal findings of blood chemistry: Secondary | ICD-10-CM | POA: Diagnosis not present

## 2017-03-29 DIAGNOSIS — J432 Centrilobular emphysema: Secondary | ICD-10-CM | POA: Insufficient documentation

## 2017-03-29 DIAGNOSIS — D71 Functional disorders of polymorphonuclear neutrophils: Secondary | ICD-10-CM | POA: Diagnosis not present

## 2017-03-29 DIAGNOSIS — I2510 Atherosclerotic heart disease of native coronary artery without angina pectoris: Secondary | ICD-10-CM | POA: Diagnosis not present

## 2017-03-29 LAB — BASIC METABOLIC PANEL
BUN / CREAT RATIO: 17 (ref 10–24)
BUN: 20 mg/dL (ref 8–27)
CO2: 20 mmol/L (ref 20–29)
CREATININE: 1.2 mg/dL (ref 0.76–1.27)
Calcium: 9.6 mg/dL (ref 8.6–10.2)
Chloride: 106 mmol/L (ref 96–106)
GFR calc Af Amer: 66 mL/min/{1.73_m2} (ref >59)
GFR, EST NON AFRICAN AMERICAN: 57 mL/min/{1.73_m2} — AB (ref >59)
GLUCOSE: 99 mg/dL (ref 65–99)
POTASSIUM: 5.1 mmol/L (ref 3.5–5.2)
SODIUM: 142 mmol/L (ref 134–144)

## 2017-03-29 MED ORDER — IOPAMIDOL (ISOVUE-370) INJECTION 76%
100.0000 mL | Freq: Once | INTRAVENOUS | Status: AC | PRN
  Administered 2017-03-29: 100 mL via INTRAVENOUS

## 2017-03-29 NOTE — Telephone Encounter (Signed)
With diuretic, CTA lets hold on additional BP med for now

## 2017-03-29 NOTE — Telephone Encounter (Signed)
Since stopping the doxazosin. He is losing fluid on furosemide. Weight 181.4 this morning. Weight 183.4 originally. However, blood pressures have gradually gone up.  10/5 140/76 10/6 143/77 10/7 144/77 10/8 148/78 10/9 150/79  Wife, Katharine Look, wants to make you aware and wants to know what to do. Please advise.

## 2017-03-29 NOTE — Telephone Encounter (Signed)
Katharine Look advised to continue to monitor BP, if it continues to rise to return call. If stabilizes and goes back to normal, will discuss at office visit 04/07/17. Katharine Look verbalized understanding, no further questions.

## 2017-04-06 ENCOUNTER — Ambulatory Visit (HOSPITAL_BASED_OUTPATIENT_CLINIC_OR_DEPARTMENT_OTHER)
Admission: RE | Admit: 2017-04-06 | Discharge: 2017-04-06 | Disposition: A | Discharge status: Home / Self Care | Payer: Medicare HMO | Source: Ambulatory Visit | Attending: Cardiology | Admitting: Cardiology

## 2017-04-06 DIAGNOSIS — I083 Combined rheumatic disorders of mitral, aortic and tricuspid valves: Secondary | ICD-10-CM | POA: Diagnosis not present

## 2017-04-06 DIAGNOSIS — E119 Type 2 diabetes mellitus without complications: Secondary | ICD-10-CM | POA: Insufficient documentation

## 2017-04-06 DIAGNOSIS — I10 Essential (primary) hypertension: Secondary | ICD-10-CM | POA: Insufficient documentation

## 2017-04-06 DIAGNOSIS — R0602 Shortness of breath: Secondary | ICD-10-CM | POA: Insufficient documentation

## 2017-04-06 DIAGNOSIS — J439 Emphysema, unspecified: Secondary | ICD-10-CM | POA: Insufficient documentation

## 2017-04-06 DIAGNOSIS — E785 Hyperlipidemia, unspecified: Secondary | ICD-10-CM | POA: Insufficient documentation

## 2017-04-06 DIAGNOSIS — I2510 Atherosclerotic heart disease of native coronary artery without angina pectoris: Secondary | ICD-10-CM | POA: Insufficient documentation

## 2017-04-06 NOTE — Progress Notes (Signed)
  Echocardiogram 2D Echocardiogram has been performed.  Scott Singh T Cydnie Deason 04/06/2017, 11:32 AM

## 2017-04-06 NOTE — Progress Notes (Signed)
Cardiology Office Note:    Date:  04/07/2017   ID:  Scott Singh, DOB 1936-04-17, MRN [id number]  PCP:  Ronita Hipps, MD  Cardiologist:  Shirlee More, MD    Referring MD: Ronita Hipps, MD    ASSESSMENT:    1. SOB (shortness of breath)   2. Coronary artery calcification seen on CT scan   3. Essential hypertension   4. Pulmonary emphysema, unspecified emphysema type ([place])   5. Hoarseness    PLAN:    In order of problems listed above:  1. Despite resolution of edema his symptoms are unchanged differential diagnosis is due to emphysema or coronary ischemia and he undergo myocardial perfusion imaging. If normal may benefit from PFTs and pulmonary evaluation 2. At increased risk of ischemia undergo myocardial perfusion study 3. Poorly controlled switch to a more potent ARB especially with cough and continue his diuretic 4. On CT scan, his myocardial perfusion studies unremarkable he would benefit from pulmonary evaluation 5. Referral to ENT   Next appointment: 4 weeks   Medication Adjustments/Labs and Tests Ordered: Current medicines are reviewed at length with the patient today.  Concerns regarding medicines are outlined above.  Orders Placed This Encounter  Procedures  . Ambulatory referral to ENT  . Myocardial Perfusion Imaging   Meds ordered this encounter  Medications  . telmisartan (MICARDIS) 80 MG tablet    Sig: Take 1 tablet (80 mg total) by mouth daily.    Dispense:  30 tablet    Refill:  3    Chief Complaint  Patient presents with  . Follow-up  . Shortness of Breath    History of Present Illness:    Scott Singh is a 81 y.o. male with a hx of hypertension and SOB last seen 2 weeks ago.his weight is down 3 pounds edema has cleared but he remained short of breath walking outdoors. He has no orthopnea PND chest pain palpitation or syncope. He has multiple other complaints including cough loss of taste lost of smell hoarseness and throat discomfort. He will  stop his ACE inhibitor switch to an ARB and I asked him to schedule evaluation with ENT for change in voice.His CT scan showed evidence of emphysema but also coronary calcification meal undergo myocardial perfusion study. I suspect that her shortness of breath and the end will be pulmonary due to previous cigarette smoking and emphysema. Compliance with diet, lifestyle and medications: yes Past Medical History:  Diagnosis Date  . Asthma   . BPH (benign prostatic hyperplasia)   . CKD (chronic kidney disease) stage 2, GFR 60-89 ml/min   . GERD (gastroesophageal reflux disease)   . Hyperlipidemia   . Hypertension   . Migraine   . Migraines   . Prediabetes   . Recurrent major depression ([place])   . Sleep apnea     Past Surgical History:  Procedure Laterality Date  . APPENDECTOMY    . APPENDECTOMY    . NASAL SEPTUM SURGERY    . REPLACEMENT TOTAL KNEE    . ROTATOR CUFF REPAIR    . shoul surg    . TOTAL HIP ARTHROPLASTY    . TOTAL KNEE ARTHROPLASTY      Current Medications: Current Meds  Medication Sig  . amLODipine (NORVASC) 5 MG tablet Take 5 mg by mouth daily.  Marland Kitchen aspirin EC 81 MG tablet Take 81 mg by mouth daily.  . bisoprolol (ZEBETA) 10 MG tablet Take 2 tablets by mouth daily.  . diphenhydrAMINE (BENADRYL) 25  mg capsule Take 25 mg by mouth 2 (two) times daily.  . furosemide (LASIX) 20 MG tablet Take 1 tablet (20 mg total) by mouth daily.  Marland Kitchen ibuprofen (ADVIL,MOTRIN) 600 MG tablet Take 1 tablet by mouth 3 (three) times daily.  . montelukast (SINGULAIR) 10 MG tablet Take 10 mg by mouth daily.  . Multiple Vitamin (MULTIVITAMIN) capsule Take 1 capsule by mouth daily.  . niacin 500 MG tablet Take 500 mg by mouth at bedtime.  . Omega-3 Fatty Acids (FISH OIL) 1200 MG CAPS Take 2 capsules by mouth 2 (two) times daily.  Marland Kitchen omeprazole (PRILOSEC) 40 MG capsule Take 1 capsule by mouth daily.  . pravastatin (PRAVACHOL) 40 MG tablet Take 40 mg by mouth daily.  Marland Kitchen pyridoxine (B-6) 100 MG tablet  Take 100 mg by mouth daily.  . ranitidine (ZANTAC) 150 MG capsule Take 150 mg by mouth every evening.  . topiramate (TOPAMAX) 25 MG tablet TAKE 2 TABLETS BY MOUTH IN THE MORNING AND 1 IN THE AFTERNOON  . Vitamin D, Ergocalciferol, (DRISDOL) 50000 units CAPS capsule Take 50,000 Units by mouth once a week.  . vitamin E 400 UNIT capsule Take 400 Units by mouth daily.     Allergies:   Contrast media [iodinated diagnostic agents]; Erythromycin; Sulpho-lac medicated soap [sulfur]; and Tetracyclines & related   Social History   Social History  . Marital status: Married    Spouse name: N/A  . Number of children: N/A  . Years of education: N/A   Social History Main Topics  . Smoking status: Former Smoker    Types: Cigarettes    Quit date: 06/22/1963  . Smokeless tobacco: Never Used  . Alcohol use No  . Drug use: No  . Sexual activity: Not Asked   Other Topics Concern  . None   Social History Narrative  . None     Family History: The patient's family history includes Diabetes in his father; Heart disease in his father and sister; Other in his mother. ROS:   Please see the history of present illness.    All other systems reviewed and are negative.  EKGs/Labs/Other Studies Reviewed:    The following studies were reviewed today: CTA of chest: IMPRESSION: 1.  Negative for acute pulmonary embolus. 2. Lower lung volumes, but otherwise no acute findings in the chest compared to 2017. 3. Aortic Atherosclerosis (ICD10-I70.0) and Emphysema (ICD10-J43.9). And calcified coronary artery atherosclerosis. 4. Mild cardiomegaly. 5. Old granulomatous disease.  Echo with normal LV systolic function, LV pressure is normal, mild AR and MR Recent Labs: 03/25/2017: BNP 88.5 03/28/2017: BUN 20; Creatinine, Ser 1.20; Potassium 5.1; Sodium 142  Recent Lipid Panel No results found for: CHOL, TRIG, HDL, CHOLHDL, VLDL, LDLCALC, LDLDIRECT  Physical Exam:    VS:  BP (!) 150/80 (BP Location: Right Arm,  Patient Position: Sitting, Cuff Size: Normal)   Pulse 64   Ht 5\' 3"  (1.6 m)   Wt 185 lb (83.9 kg)   SpO2 97%   BMI 32.77 kg/m     Wt Readings from Last 3 Encounters:  04/07/17 185 lb (83.9 kg)  03/24/17 188 lb 1.9 oz (85.3 kg)     GEN:  Well nourished, well developed in no acute distress HEENT: Normal NECK: No JVD; No carotid bruits LYMPHATICS: No lymphadenopathy CARDIAC: RRR, no murmurs, rubs, gallops RESPIRATORY:  Clear to auscultation without rales, wheezing or rhonchi  ABDOMEN: Soft, non-tender, non-distended MUSCULOSKELETAL:  No edema; No deformity  SKIN: Warm and dry NEUROLOGIC:  Alert  and oriented x 3 PSYCHIATRIC:  Normal affect    Signed, Shirlee More, MD  04/07/2017 4:21 PM    [place]

## 2017-04-07 ENCOUNTER — Encounter: Payer: Self-pay | Admitting: Cardiology

## 2017-04-07 ENCOUNTER — Ambulatory Visit (INDEPENDENT_AMBULATORY_CARE_PROVIDER_SITE_OTHER): Payer: Medicare HMO | Admitting: Cardiology

## 2017-04-07 VITALS — BP 150/80 | HR 64 | Ht 63.0 in | Wt 185.0 lb

## 2017-04-07 DIAGNOSIS — I2510 Atherosclerotic heart disease of native coronary artery without angina pectoris: Secondary | ICD-10-CM | POA: Diagnosis not present

## 2017-04-07 DIAGNOSIS — R0602 Shortness of breath: Secondary | ICD-10-CM | POA: Diagnosis not present

## 2017-04-07 DIAGNOSIS — R490 Dysphonia: Secondary | ICD-10-CM | POA: Insufficient documentation

## 2017-04-07 DIAGNOSIS — J439 Emphysema, unspecified: Secondary | ICD-10-CM | POA: Diagnosis not present

## 2017-04-07 DIAGNOSIS — I10 Essential (primary) hypertension: Secondary | ICD-10-CM

## 2017-04-07 MED ORDER — TELMISARTAN 80 MG PO TABS: 80.0000 mg | ORAL_TABLET | Freq: Every day | ORAL | 3 refills | Status: DC

## 2017-04-07 NOTE — Patient Instructions (Addendum)
Medication Instructions:  Your physician has recommended you make the following change in your medication:  STOP lisinopril START telmisartan (Micardis) 80 mg daily  Labwork: None  Testing/Procedures: Your physician has requested that you have en exercise stress myoview. For further information please visit [email or web address]. Please follow instruction sheet, as given.  Follow-Up: Your physician recommends that you schedule a follow-up appointment in: 4 weeks.  A referral has been made with Dr. Gaylyn Cheers, ENT in [place]. You will receive a phone call from their office to schedule an appointment.  Any Other Special Instructions Will Be Listed Below (If Applicable).     If you need a refill on your cardiac medications before your next appointment, please call your pharmacy.

## 2017-04-08 ENCOUNTER — Telehealth: Payer: Self-pay | Admitting: Cardiology

## 2017-04-08 NOTE — Telephone Encounter (Signed)
Pt has some discrepancies with his medication and his AVS says to stop meds he's not even on

## 2017-04-08 NOTE — Telephone Encounter (Signed)
Katharine Look notified that start telemisartan 80mg  daily as prescirbed by Dr Bettina Gavia.  Katharine Look verbalized understanding.

## 2017-04-11 ENCOUNTER — Telehealth (HOSPITAL_COMMUNITY): Payer: Self-pay | Admitting: *Deleted

## 2017-04-11 NOTE — Telephone Encounter (Signed)
Left message on voicemail per DPR in reference to upcoming appointment scheduled on 04/12/17 with detailed instructions given per Myocardial Perfusion Study Information Sheet for the test. LM to arrive 15 minutes early, and that it is imperative to arrive on time for appointment to keep from having the test rescheduled. If you need to cancel or reschedule your appointment, please call the office within 24 hours of your appointment. Failure to do so may result in a cancellation of your appointment, and a $50 no show fee. Phone number given for call back for any questions. Kirstie Peri

## 2017-04-12 ENCOUNTER — Ambulatory Visit (HOSPITAL_COMMUNITY): Payer: Medicare HMO | Attending: Cardiology

## 2017-04-12 VITALS — Ht 63.0 in | Wt 181.0 lb

## 2017-04-12 DIAGNOSIS — I2510 Atherosclerotic heart disease of native coronary artery without angina pectoris: Secondary | ICD-10-CM | POA: Diagnosis not present

## 2017-04-12 LAB — MYOCARDIAL PERFUSION IMAGING
CHL CUP NUCLEAR SDS: 0
CHL CUP NUCLEAR SRS: 1
CHL CUP NUCLEAR SSS: 1
CHL CUP RESTING HR STRESS: 64 {beats}/min
LV dias vol: 91 mL (ref 62–150)
LVSYSVOL: 31 mL
Peak HR: 88 {beats}/min
RATE: 0.38
TID: 0.94

## 2017-04-12 MED ORDER — TECHNETIUM TC 99M TETROFOSMIN IV KIT
32.5000 | PACK | Freq: Once | INTRAVENOUS | Status: AC | PRN
  Administered 2017-04-12: 32.5 via INTRAVENOUS
  Filled 2017-04-12: qty 33

## 2017-04-12 MED ORDER — REGADENOSON 0.4 MG/5ML IV SOLN
0.4000 mg | Freq: Once | INTRAVENOUS | Status: AC
  Administered 2017-04-12: 0.4 mg via INTRAVENOUS

## 2017-04-12 MED ORDER — TECHNETIUM TC 99M TETROFOSMIN IV KIT
11.0000 | PACK | Freq: Once | INTRAVENOUS | Status: AC | PRN
  Administered 2017-04-12: 11 via INTRAVENOUS
  Filled 2017-04-12: qty 11

## 2017-04-14 DIAGNOSIS — H9193 Unspecified hearing loss, bilateral: Secondary | ICD-10-CM | POA: Diagnosis not present

## 2017-04-14 DIAGNOSIS — K219 Gastro-esophageal reflux disease without esophagitis: Secondary | ICD-10-CM | POA: Diagnosis not present

## 2017-04-14 DIAGNOSIS — J342 Deviated nasal septum: Secondary | ICD-10-CM | POA: Diagnosis not present

## 2017-04-14 DIAGNOSIS — R5383 Other fatigue: Secondary | ICD-10-CM | POA: Diagnosis not present

## 2017-04-14 DIAGNOSIS — Z9889 Other specified postprocedural states: Secondary | ICD-10-CM | POA: Diagnosis not present

## 2017-04-14 DIAGNOSIS — J439 Emphysema, unspecified: Secondary | ICD-10-CM | POA: Diagnosis not present

## 2017-04-14 DIAGNOSIS — R0609 Other forms of dyspnea: Secondary | ICD-10-CM | POA: Diagnosis not present

## 2017-04-14 DIAGNOSIS — R05 Cough: Secondary | ICD-10-CM | POA: Diagnosis not present

## 2017-04-14 DIAGNOSIS — R490 Dysphonia: Secondary | ICD-10-CM | POA: Diagnosis not present

## 2017-04-20 ENCOUNTER — Other Ambulatory Visit (HOSPITAL_BASED_OUTPATIENT_CLINIC_OR_DEPARTMENT_OTHER): Payer: Medicare HMO

## 2017-04-21 DIAGNOSIS — I10 Essential (primary) hypertension: Secondary | ICD-10-CM | POA: Diagnosis not present

## 2017-04-21 DIAGNOSIS — Z9181 History of falling: Secondary | ICD-10-CM | POA: Diagnosis not present

## 2017-04-21 DIAGNOSIS — Z6831 Body mass index (BMI) 31.0-31.9, adult: Secondary | ICD-10-CM | POA: Diagnosis not present

## 2017-04-21 DIAGNOSIS — R0609 Other forms of dyspnea: Secondary | ICD-10-CM | POA: Diagnosis not present

## 2017-04-21 DIAGNOSIS — N182 Chronic kidney disease, stage 2 (mild): Secondary | ICD-10-CM | POA: Diagnosis not present

## 2017-04-21 DIAGNOSIS — J439 Emphysema, unspecified: Secondary | ICD-10-CM | POA: Diagnosis not present

## 2017-04-21 DIAGNOSIS — Z7689 Persons encountering health services in other specified circumstances: Secondary | ICD-10-CM | POA: Diagnosis not present

## 2017-04-21 DIAGNOSIS — Z1331 Encounter for screening for depression: Secondary | ICD-10-CM | POA: Diagnosis not present

## 2017-04-21 DIAGNOSIS — E785 Hyperlipidemia, unspecified: Secondary | ICD-10-CM | POA: Diagnosis not present

## 2017-04-21 DIAGNOSIS — Z1339 Encounter for screening examination for other mental health and behavioral disorders: Secondary | ICD-10-CM | POA: Diagnosis not present

## 2017-05-05 ENCOUNTER — Ambulatory Visit: Payer: Medicare HMO | Admitting: Cardiology

## 2017-05-05 ENCOUNTER — Encounter: Payer: Self-pay | Admitting: Cardiology

## 2017-05-05 VITALS — BP 132/60 | HR 67 | Ht 63.0 in | Wt 187.8 lb

## 2017-05-05 DIAGNOSIS — I10 Essential (primary) hypertension: Secondary | ICD-10-CM | POA: Diagnosis not present

## 2017-05-05 DIAGNOSIS — R0602 Shortness of breath: Secondary | ICD-10-CM | POA: Diagnosis not present

## 2017-05-05 DIAGNOSIS — J439 Emphysema, unspecified: Secondary | ICD-10-CM

## 2017-05-05 MED ORDER — TELMISARTAN 80 MG PO TABS: 80.0000 mg | ORAL_TABLET | Freq: Every day | ORAL | 3 refills | Status: AC

## 2017-05-05 MED ORDER — FUROSEMIDE 20 MG PO TABS: 20.0000 mg | ORAL_TABLET | Freq: Every day | ORAL | 3 refills | Status: DC

## 2017-05-05 MED ORDER — AMLODIPINE BESYLATE 5 MG PO TABS: 5.0000 mg | ORAL_TABLET | Freq: Every day | ORAL | 3 refills | Status: DC

## 2017-05-05 MED ORDER — TOPIRAMATE 25 MG PO TABS: ORAL_TABLET | ORAL | 0 refills | Status: AC

## 2017-05-05 MED ORDER — BISOPROLOL FUMARATE 10 MG PO TABS: 20.0000 mg | ORAL_TABLET | Freq: Every day | ORAL | 3 refills | Status: DC

## 2017-05-05 NOTE — Progress Notes (Signed)
Cardiology Office Note:    Date:  05/05/2017   ID:  Scott Singh, DOB Aug 25, 1935, MRN [id number]  PCP:  Mateo Flow, MD  Cardiologist:  Shirlee More, MD    Referring MD: Ronita Hipps, MD    ASSESSMENT:    1. SOB (shortness of breath)   2. Pulmonary emphysema, unspecified emphysema type ([place])   3. Essential hypertension    PLAN:    In order of problems listed above:  1. He is improved with control of his blood pressure low-dose diuretic and reassurance of his normal echocardiogram and myocardial perfusion test.  He has no evidence of heart failure or coronary stenosis and ischemia. 2. Stable presently asymptomatic I encouraged him to get into a regular activity program 3. Stable blood pressure target runs at home 130-145    Next appointment: As needed   Medication Adjustments/Labs and Tests Ordered: Current medicines are reviewed at length with the patient today.  Concerns regarding medicines are outlined above.  No orders of the defined types were placed in this encounter.  Meds ordered this encounter  Medications  . topiramate (TOPAMAX) 25 MG tablet    Sig: TAKE 2 TABLETS BY MOUTH IN THE MORNING AND 1 IN THE AFTERNOON    Dispense:  90 tablet    Refill:  0  . telmisartan (MICARDIS) 80 MG tablet    Sig: Take 1 tablet (80 mg total) daily by mouth.    Dispense:  30 tablet    Refill:  3  . amLODipine (NORVASC) 5 MG tablet    Sig: Take 1 tablet (5 mg total) daily by mouth.    Dispense:  90 tablet    Refill:  3  . bisoprolol (ZEBETA) 10 MG tablet    Sig: Take 2 tablets (20 mg total) daily by mouth.    Dispense:  180 tablet    Refill:  3  . furosemide (LASIX) 20 MG tablet    Sig: Take 1 tablet (20 mg total) daily by mouth.    Dispense:  90 tablet    Refill:  3    Chief Complaint  Patient presents with  . Follow-up    follow up after nuclear and echo    History of Present Illness:    Scott Singh is a 81 y.o. male with a hx of  hypertension and SOB l last  seen one month ago. Seeen by ENT for hoarseness Compliance with diet, lifestyle and medications: Yes Overall he feels improved and complained of hoarseness and asked him to have an ENT exam which apparently was normal.  With his blood pressure controlled he is no longer short of breath BP is at target his cardiac echo myocardial perfusion studies were reassuring left further follow-up my office as needed.  I did renew his migraine medication for 1 month until he sees his PCP Past Medical History:  Diagnosis Date  . Asthma   . BPH (benign prostatic hyperplasia)   . CKD (chronic kidney disease) stage 2, GFR 60-89 ml/min   . GERD (gastroesophageal reflux disease)   . Hyperlipidemia   . Hypertension   . Migraine   . Migraines   . Prediabetes   . Recurrent major depression ([place])   . Sleep apnea     Past Surgical History:  Procedure Laterality Date  . APPENDECTOMY    . APPENDECTOMY    . NASAL SEPTUM SURGERY    . REPLACEMENT TOTAL KNEE    . ROTATOR CUFF REPAIR    .  shoul surg    . TOTAL HIP ARTHROPLASTY    . TOTAL KNEE ARTHROPLASTY      Current Medications: Current Meds  Medication Sig  . amLODipine (NORVASC) 5 MG tablet Take 1 tablet (5 mg total) daily by mouth.  Marland Kitchen aspirin EC 81 MG tablet Take 81 mg by mouth daily.  . bisoprolol (ZEBETA) 10 MG tablet Take 2 tablets (20 mg total) daily by mouth.  . diphenhydrAMINE (BENADRYL) 25 mg capsule Take 25 mg by mouth 2 (two) times daily.  . furosemide (LASIX) 20 MG tablet Take 1 tablet (20 mg total) daily by mouth.  Marland Kitchen ibuprofen (ADVIL,MOTRIN) 600 MG tablet Take 1 tablet by mouth 3 (three) times daily.  . montelukast (SINGULAIR) 10 MG tablet Take 10 mg by mouth daily.  . Multiple Vitamin (MULTIVITAMIN) capsule Take 1 capsule by mouth daily.  . niacin 500 MG tablet Take 500 mg by mouth at bedtime.  . Omega-3 Fatty Acids (FISH OIL) 1200 MG CAPS Take 2 capsules by mouth 2 (two) times daily.  Marland Kitchen omeprazole (PRILOSEC) 40 MG capsule Take 1 capsule  by mouth daily.  . pravastatin (PRAVACHOL) 40 MG tablet Take 40 mg by mouth daily.  Marland Kitchen pyridoxine (B-6) 100 MG tablet Take 100 mg by mouth daily.  . ranitidine (ZANTAC) 150 MG capsule Take 150 mg by mouth every evening.  Marland Kitchen telmisartan (MICARDIS) 80 MG tablet Take 1 tablet (80 mg total) daily by mouth.  . topiramate (TOPAMAX) 25 MG tablet TAKE 2 TABLETS BY MOUTH IN THE MORNING AND 1 IN THE AFTERNOON  . Vitamin D, Ergocalciferol, (DRISDOL) 50000 units CAPS capsule Take 50,000 Units by mouth once a week.  . vitamin E 400 UNIT capsule Take 400 Units by mouth daily.  . [DISCONTINUED] amLODipine (NORVASC) 5 MG tablet Take 5 mg by mouth daily.  . [DISCONTINUED] bisoprolol (ZEBETA) 10 MG tablet Take 2 tablets by mouth daily.  . [DISCONTINUED] furosemide (LASIX) 20 MG tablet Take 1 tablet (20 mg total) by mouth daily.  . [DISCONTINUED] telmisartan (MICARDIS) 80 MG tablet Take 1 tablet (80 mg total) by mouth daily.  . [DISCONTINUED] topiramate (TOPAMAX) 25 MG tablet TAKE 2 TABLETS BY MOUTH IN THE MORNING AND 1 IN THE AFTERNOON     Allergies:   Contrast media [iodinated diagnostic agents]; Erythromycin; Sulpho-lac medicated soap [sulfur]; and Tetracyclines & related   Social History   Socioeconomic History  . Marital status: Married    Spouse name: None  . Number of children: None  . Years of education: None  . Highest education level: None  Social Needs  . Financial resource strain: None  . Food insecurity - worry: None  . Food insecurity - inability: None  . Transportation needs - medical: None  . Transportation needs - non-medical: None  Occupational History  . None  Tobacco Use  . Smoking status: Former Smoker    Types: Cigarettes    Last attempt to quit: 06/22/1963    Years since quitting: 53.9  . Smokeless tobacco: Never Used  Substance and Sexual Activity  . Alcohol use: No  . Drug use: No  . Sexual activity: None  Other Topics Concern  . None  Social History Narrative  . None       Family History: The patient's family history includes Diabetes in his father; Heart disease in his father and sister; Other in his mother. ROS:   Please see the history of present illness.    All other systems reviewed and are negative.  EKGs/Labs/Other Studies Reviewed:    The following studies were reviewed today:  CTA chest: IMPRESSION: 1.  Negative for acute pulmonary embolus. 2. Lower lung volumes, but otherwise no acute findings in the chest compared to 2017. 3. Aortic Atherosclerosis (ICD10-I70.0) and Emphysema (ICD10-J43.9). And calcified coronary artery atherosclerosis. 4. Mild cardiomegaly. 5. Old granulomatous disease.  MPI:  Overall Study Impression Myocardial perfusion is abnormal. There is a small defect of mild severity present in the apex location. The defect is non-reversible and in the setting of normal LVF, is likely related to diaphragmatic attenuation artifact. No ischemia noted. This is a low risk study. Overall left ventricular systolic function was normal. LV cavity size is normal. Nuclear stress EF: 66%. The left ventricular ejection fraction is hyperdynamic (>65%). There is no prior study for comparison.   Echo: - Normal left ventricular systolic function, visually estimated at   60-65%. Impaired left ventricular relaxation.   Mild aortic regurgitation   Mild mitral regurgitation   Mild tricuspid regurgitation.  recent Labs: 03/25/2017: BNP 88.5 03/28/2017: BUN 20; Creatinine, Ser 1.20; Potassium 5.1; Sodium 142  Recent Lipid Panel No results found for: CHOL, TRIG, HDL, CHOLHDL, VLDL, LDLCALC, LDLDIRECT  Physical Exam:    VS:  BP 132/60 (BP Location: Left Arm, Patient Position: Sitting)   Pulse 67   Ht 5\' 3"  (1.6 m)   Wt 187 lb 12.8 oz (85.2 kg)   SpO2 97%   BMI 33.27 kg/m     Wt Readings from Last 3 Encounters:  05/05/17 187 lb 12.8 oz (85.2 kg)  04/12/17 181 lb (82.1 kg)  04/07/17 185 lb (83.9 kg)     GEN:  Well nourished, well  developed in no acute distress HEENT: Normal NECK: No JVD; No carotid bruits LYMPHATICS: No lymphadenopathy CARDIAC: RRR, no murmurs, rubs, gallops RESPIRATORY:  Clear to auscultation without rales, wheezing or rhonchi  ABDOMEN: Soft, non-tender, non-distended MUSCULOSKELETAL:  No edema; No deformity  SKIN: Warm and dry NEUROLOGIC:  Alert and oriented x 3 PSYCHIATRIC:  Normal affect    Signed, Shirlee More, MD  05/05/2017 2:38 PM    [place]

## 2017-05-05 NOTE — Patient Instructions (Signed)

## 2017-05-18 ENCOUNTER — Ambulatory Visit: Payer: Medicare HMO | Admitting: Internal Medicine

## 2017-05-18 ENCOUNTER — Encounter: Payer: Self-pay | Admitting: Internal Medicine

## 2017-05-18 VITALS — BP 110/60 | HR 65 | Ht 63.5 in | Wt 186.0 lb

## 2017-05-18 DIAGNOSIS — R0602 Shortness of breath: Secondary | ICD-10-CM

## 2017-05-18 DIAGNOSIS — R490 Dysphonia: Secondary | ICD-10-CM | POA: Diagnosis not present

## 2017-05-18 NOTE — Assessment & Plan Note (Addendum)
04/06/17 echo- Normal left ventricular systolic function, visually estimated at   60-65%. Impaired left ventricular relaxation.   Mild aortic regurgitation   Mild mitral regurgitation   Mild tricuspid regurgitation 05/18/2017  Walked RA x 3 laps @ 185 ft each stopped due to  End of study, nl pace, no   desat  But sob  - Spirometry 05/18/2017  Nl/ min curvature  - max gerd diet/ ppi/h2 hs and regular paced ex rec    Symptoms are markedly disproportionate to objective findings and not clear this is actually much of a  lung problem but pt does appear to have difficult to sort out respiratory symptoms of unknown origin for which  DDX  = almost all start with A and  include Adherence, Ace Inhibitors, Acid Reflux, Active Sinus Disease, Alpha 1 Antitripsin deficiency, Anxiety masquerading as Airways dz,  ABPA,  Allergy(esp in young), Aspiration (esp in elderly), Adverse effects of meds,  Active smokers, A bunch of PE's (a small clot burden can't cause this syndrome unless there is already severe underlying pulm or vascular dz with poor reserve) plus two Bs  = Bronchiectasis and Beta blocker use..and one C= CHF     Adherence is always the initial "prime suspect" and is a multilayered concern that requires a "trust but verify" approach in every patient - starting with knowing how to use medications, especially inhalers, correctly, keeping up with refills and understanding the fundamental difference between maintenance and prns vs those medications only taken for a very short course and then stopped and not refilled. - advised to return with all meds in hand using a trust but verify approach to confirm accurate Medication  Reconciliation The principal here is that until we are certain that the  patients are doing what we've asked, it makes no sense to ask them to do more.     ? Acid (or non-acid) GERD > always difficult to exclude as up to 75% of pts in some series report no assoc GI/ Heartburn symptoms> rec  max (24h)  acid suppression and diet restrictions/ reviewed and instructions given in writing.   ? ACEi effects > ideally needs full 6 weeks off. Although even in retrospect it may not be clear the ACEi contributed to the pt's symptoms,  Pt improved off them and adding them back at this point or in the future would risk confusion in interpretation of non-specific respiratory symptoms to which this patient is prone  ie  Better not to muddy the waters here.   ? Allergy/asthma > lack of variability or assoc rhinitis or noct symptoms rules against but not out > ok to continue singulair for now   ? Anxiety/deconditioning with wt gain > try regular paced ex    ? A bunch of PE's > neg CT a 19/91/8  ? BB effects > very unlikely on bisoprolol the most specific BB available in generic form  ? CHF / diastolic dysfunction > rx per cards    Will try reconditioning/ max rx for gerd and re-eval in 6 wks with full pfts then ? Needs cpsT to sort out if not improving    Total time devoted to counseling  > 50 % of initial 60 min office visit:  review case with pt/ discussion of options/alternatives/ personally creating written customized instructions  in presence of pt  then going over those specific  Instructions directly with the pt including how to use all of the meds but in particular covering each new medication  in detail and the difference between the maintenance= "automatic" meds and the prns using an action plan format for the latter (If this problem/symptom => do that organization reading Left to right).  Please see AVS from this visit for a full list of these instructions which I personally wrote for this pt and  are unique to this visit.

## 2017-05-18 NOTE — Patient Instructions (Addendum)
Change your omeprazole to 40 mg Take 30-60 min before first meal of the day and continue the zantac (ranitidine) after supper or at bedtime, whichever works best for you   You need to start doing submaximal sustained exercise x 30 minutes daily at a level where you are short of breath, never out of breath, to see if you note improvement over several weeks.  GERD (REFLUX)  is an extremely common cause of respiratory symptoms just like yours , many times with no obvious heartburn at all.    It can be treated with medication, but also with lifestyle changes including elevation of the head of your bed (ideally with 6 inch  bed blocks),  Smoking cessation, avoidance of late meals, excessive alcohol, and avoid fatty foods, chocolate, peppermint, colas, red wine, and acidic juices such as orange juice.  NO MINT OR MENTHOL PRODUCTS SO NO COUGH DROPS   USE SUGARLESS CANDY INSTEAD (Jolley ranchers or Stover's or Life Savers) or even ice chips will also do - the key is to swallow to prevent all throat clearing. NO OIL BASED VITAMINS - use powdered substitutes.   Please schedule a follow up office visit in 6 weeks, call sooner if needed with pfts on return

## 2017-05-18 NOTE — Progress Notes (Signed)
Subjective:     Patient ID: Scott Singh, male   DOB: [birth year],   MRN: [id number]  HPI  84 yowm not a regular smoker quit 1970 onset doe fall of 2017 first noted going uphill from shop to house routine which was the routine since  2014 no better with inhalers assoc with voice fatigue and s/p neg cards w/u so referred to pulmonary clinic 05/18/2017 by  Antonietta Breach NP    05/18/2017 1st [facility] office visit/ Angelia Hazell  Off acei ? X one month Chief Complaint  Patient presents with  . Pulmonary Consult    Referred by Antonietta Breach, NP. Pt c/o DOE for the past year. He gets winded walking approx 50 ft.   indolent onset progressive doe x one year assoc with classic voice fatigue and dry daytime cough  Breathing was already bad before hip surgery 04/2016 and much worse p recovery/ rehab  Changed to ARB Oct 2018 no better  also has bad hb on gerd rx but not ac  No better with symbicort/ breo / advair ? While on acei ? If ever tried spiriva "can't afford it anyway'   No obvious day to day or daytime variability or assoc excess/ purulent sputum or mucus plugs or hemoptysis or cp or chest tightness, subjective wheeze or overt sinus  symptoms. No unusual exposure hx or h/o childhood pna/ asthma or knowledge of premature birth.  Sleeping ok flat without nocturnal  or early am exacerbation  of respiratory  c/o's or need for noct saba. Also denies any obvious fluctuation of symptoms with weather or environmental changes or other aggravating or alleviating factors except as outlined above   Current Allergies, Complete Past Medical History, Past Surgical History, Family History, and Social History were reviewed in [employer] record.  ROS  The following are not active complaints unless bolded Hoarseness, sore throat, dysphagia, dental problems, itching, sneezing,  nasal congestion or discharge of excess mucus or purulent secretions, ear ache,   fever, chills, sweats, unintended wt  loss or wt gain, classically pleuritic or exertional cp,  orthopnea pnd or leg swelling, presyncope, palpitations, abdominal pain, anorexia, nausea, vomiting, diarrhea  or change in bowel habits or change in bladder habits, change in stools or change in urine, dysuria, hematuria,  rash, arthralgias, visual complaints, headache, numbness, weakness or ataxia or problems with walking or coordination,  change in mood/affect or memory.        Current Meds  Medication Sig  . amLODipine (NORVASC) 5 MG tablet Take 1 tablet (5 mg total) daily by mouth.  Marland Kitchen aspirin EC 81 MG tablet Take 81 mg by mouth daily.  . bisoprolol (ZEBETA) 10 MG tablet Take 2 tablets (20 mg total) daily by mouth.  . diphenhydrAMINE (BENADRYL) 25 mg capsule Take 25 mg by mouth 2 (two) times daily.  . furosemide (LASIX) 20 MG tablet Take 1 tablet (20 mg total) daily by mouth.  Marland Kitchen ibuprofen (ADVIL,MOTRIN) 600 MG tablet Take 1 tablet by mouth 3 (three) times daily.  . montelukast (SINGULAIR) 10 MG tablet Take 10 mg by mouth daily.  . Multiple Vitamin (MULTIVITAMIN) capsule Take 1 capsule by mouth daily.  . niacin 500 MG tablet Take 500 mg by mouth at bedtime.  . Omega-3 Fatty Acids (FISH OIL) 1200 MG CAPS Take 2 capsules by mouth 2 (two) times daily.  Marland Kitchen omeprazole (PRILOSEC) 40 MG capsule Take 1 capsule by mouth daily.  . pravastatin (PRAVACHOL) 40 MG tablet Take 40 mg  by mouth daily.  Marland Kitchen pyridoxine (B-6) 100 MG tablet Take 100 mg by mouth daily.  . ranitidine (ZANTAC) 150 MG capsule Take 150 mg by mouth every evening.  Marland Kitchen telmisartan (MICARDIS) 80 MG tablet Take 1 tablet (80 mg total) daily by mouth.  . topiramate (TOPAMAX) 25 MG tablet TAKE 2 TABLETS BY MOUTH IN THE MORNING AND 1 IN THE AFTERNOON  . Vitamin D, Ergocalciferol, (DRISDOL) 50000 units CAPS capsule Take 50,000 Units by mouth once a week.  . vitamin E 400 UNIT capsule Take 400 Units by mouth daily.         Review of Systems     Objective:   Physical Exam    amb  mod obese wm nad with classic voice fatigue   Wt Readings from Last 3 Encounters:  05/18/17 186 lb (84.4 kg)  05/05/17 187 lb 12.8 oz (85.2 kg)  04/12/17 181 lb (82.1 kg)    Vital signs reviewed   - Note on arrival 02 sats  96% on RA     HEENT: nl dentition, turbinates bilaterally, and oropharynx. Nl external ear canals without cough reflex   NECK :  without JVD/Nodes/TM/ nl carotid upstrokes bilaterally   LUNGS: no acc muscle use,  Nl contour chest which is clear to A and P bilaterally without cough on insp or exp maneuvers   CV:  RRR  no s3 or murmur or increase in P2, and no edema   ABD:  Obese soft and nontender with limited  inspiratory excursion in the supine position. No bruits or organomegaly appreciated, bowel sounds nl  MS:  Nl gait/ ext warm without deformities, calf tenderness, cyanosis or clubbing No obvious joint restrictions   SKIN: warm and dry without lesions    NEURO:  alert, approp, nl sensorium with  no motor or cerebellar deficits apparent.      I personally reviewed images and agree with radiology impression as follows:   Chest CTa 03/29/17 1.  Negative for acute pulmonary embolus. 2. Lower lung volumes, but otherwise no acute findings in the chest compared to 2017. 3. Aortic Atherosclerosis (ICD10-I70.0) and Emphysema (ICD10-J43.9). And calcified coronary artery atherosclerosis. 4. Mild cardiomegaly. 5. Old granulomatous disease.       Assessment:

## 2017-05-18 NOTE — Assessment & Plan Note (Addendum)
Off acei oct 2018 > no better as of 05/18/2017 rec max gerd rx/diet  - neg ent eval per PCP notes  Likely this is Upper airway cough syndrome (previously labeled PNDS),  is so named because it's frequently impossible to sort out how much is  CR/sinusitis with freq throat clearing (which can be related to primary GERD)   vs  causing  secondary (" extra esophageal")  GERD from wide swings in gastric pressure that occur with throat clearing, often  promoting self use of mint and menthol lozenges that reduce the lower esophageal sphincter tone and exacerbate the problem further in a cyclical fashion.   These are the same pts (now being labeled as having "irritable larynx syndrome" by some cough centers) who not infrequently have a history of having failed to tolerate ace inhibitors,  dry powder inhalers (like advair, which didn't help) or biphosphonates or report having atypical/extraesophageal reflux symptoms that don't respond to standard doses of PPI  and are easily confused as having aecopd or asthma flares by even experienced allergists/ pulmonologists (myself included).   Would keep off acei, discourage throat clearing, consider second opinion ent eval p first of year if not better (Dr Carol Ada at [facility]) vs trial of low dose gabapentin (100 tid x 4 weeks)

## 2017-06-21 HISTORY — PX: TOTAL HIP ARTHROPLASTY: SHX124

## 2017-06-21 HISTORY — PX: CATARACT EXTRACTION: SUR2

## 2017-06-22 DIAGNOSIS — Z0000 Encounter for general adult medical examination without abnormal findings: Secondary | ICD-10-CM | POA: Diagnosis not present

## 2017-06-22 DIAGNOSIS — Z125 Encounter for screening for malignant neoplasm of prostate: Secondary | ICD-10-CM | POA: Diagnosis not present

## 2017-06-22 DIAGNOSIS — N183 Chronic kidney disease, stage 3 (moderate): Secondary | ICD-10-CM | POA: Diagnosis not present

## 2017-06-22 DIAGNOSIS — R7301 Impaired fasting glucose: Secondary | ICD-10-CM | POA: Diagnosis not present

## 2017-06-22 DIAGNOSIS — E669 Obesity, unspecified: Secondary | ICD-10-CM | POA: Diagnosis not present

## 2017-06-22 DIAGNOSIS — I1310 Hypertensive heart and chronic kidney disease without heart failure, with stage 1 through stage 4 chronic kidney disease, or unspecified chronic kidney disease: Secondary | ICD-10-CM | POA: Diagnosis not present

## 2017-06-22 DIAGNOSIS — Z6830 Body mass index (BMI) 30.0-30.9, adult: Secondary | ICD-10-CM | POA: Diagnosis not present

## 2017-06-22 DIAGNOSIS — E785 Hyperlipidemia, unspecified: Secondary | ICD-10-CM | POA: Diagnosis not present

## 2017-06-22 DIAGNOSIS — E559 Vitamin D deficiency, unspecified: Secondary | ICD-10-CM | POA: Diagnosis not present

## 2017-06-22 DIAGNOSIS — Z79899 Other long term (current) drug therapy: Secondary | ICD-10-CM | POA: Diagnosis not present

## 2017-07-01 ENCOUNTER — Ambulatory Visit: Payer: Medicare HMO | Admitting: Internal Medicine

## 2017-07-19 DIAGNOSIS — M47812 Spondylosis without myelopathy or radiculopathy, cervical region: Secondary | ICD-10-CM | POA: Diagnosis not present

## 2017-08-30 DIAGNOSIS — M47812 Spondylosis without myelopathy or radiculopathy, cervical region: Secondary | ICD-10-CM | POA: Diagnosis not present

## 2017-09-02 DIAGNOSIS — M542 Cervicalgia: Secondary | ICD-10-CM | POA: Diagnosis not present

## 2017-09-02 DIAGNOSIS — M4802 Spinal stenosis, cervical region: Secondary | ICD-10-CM | POA: Diagnosis not present

## 2017-09-02 DIAGNOSIS — M4722 Other spondylosis with radiculopathy, cervical region: Secondary | ICD-10-CM | POA: Diagnosis not present

## 2017-09-07 DIAGNOSIS — R29898 Other symptoms and signs involving the musculoskeletal system: Secondary | ICD-10-CM | POA: Diagnosis not present

## 2017-09-07 DIAGNOSIS — M47812 Spondylosis without myelopathy or radiculopathy, cervical region: Secondary | ICD-10-CM | POA: Diagnosis not present

## 2017-09-16 DIAGNOSIS — M50322 Other cervical disc degeneration at C5-C6 level: Secondary | ICD-10-CM | POA: Diagnosis not present

## 2017-09-16 DIAGNOSIS — M50123 Cervical disc disorder at C6-C7 level with radiculopathy: Secondary | ICD-10-CM | POA: Diagnosis not present

## 2017-09-16 DIAGNOSIS — M50323 Other cervical disc degeneration at C6-C7 level: Secondary | ICD-10-CM | POA: Diagnosis not present

## 2017-09-16 DIAGNOSIS — M47812 Spondylosis without myelopathy or radiculopathy, cervical region: Secondary | ICD-10-CM | POA: Diagnosis not present

## 2017-09-16 DIAGNOSIS — M50321 Other cervical disc degeneration at C4-C5 level: Secondary | ICD-10-CM | POA: Diagnosis not present

## 2018-01-23 DIAGNOSIS — H2510 Age-related nuclear cataract, unspecified eye: Secondary | ICD-10-CM | POA: Diagnosis not present

## 2018-01-23 DIAGNOSIS — H524 Presbyopia: Secondary | ICD-10-CM | POA: Diagnosis not present

## 2018-01-24 DIAGNOSIS — H04123 Dry eye syndrome of bilateral lacrimal glands: Secondary | ICD-10-CM | POA: Diagnosis not present

## 2018-01-24 DIAGNOSIS — H25813 Combined forms of age-related cataract, bilateral: Secondary | ICD-10-CM | POA: Diagnosis not present

## 2018-02-07 DIAGNOSIS — Z6828 Body mass index (BMI) 28.0-28.9, adult: Secondary | ICD-10-CM | POA: Diagnosis not present

## 2018-02-07 DIAGNOSIS — Z79899 Other long term (current) drug therapy: Secondary | ICD-10-CM | POA: Diagnosis not present

## 2018-02-07 DIAGNOSIS — L720 Epidermal cyst: Secondary | ICD-10-CM | POA: Diagnosis not present

## 2018-02-07 DIAGNOSIS — G43109 Migraine with aura, not intractable, without status migrainosus: Secondary | ICD-10-CM | POA: Diagnosis not present

## 2018-02-07 DIAGNOSIS — I1310 Hypertensive heart and chronic kidney disease without heart failure, with stage 1 through stage 4 chronic kidney disease, or unspecified chronic kidney disease: Secondary | ICD-10-CM | POA: Diagnosis not present

## 2018-02-07 DIAGNOSIS — L821 Other seborrheic keratosis: Secondary | ICD-10-CM | POA: Diagnosis not present

## 2018-02-07 DIAGNOSIS — R7303 Prediabetes: Secondary | ICD-10-CM | POA: Diagnosis not present

## 2018-02-07 DIAGNOSIS — R69 Illness, unspecified: Secondary | ICD-10-CM | POA: Diagnosis not present

## 2018-02-07 DIAGNOSIS — E785 Hyperlipidemia, unspecified: Secondary | ICD-10-CM | POA: Diagnosis not present

## 2018-02-07 DIAGNOSIS — K219 Gastro-esophageal reflux disease without esophagitis: Secondary | ICD-10-CM | POA: Diagnosis not present

## 2018-02-07 DIAGNOSIS — N183 Chronic kidney disease, stage 3 (moderate): Secondary | ICD-10-CM | POA: Diagnosis not present

## 2018-02-07 DIAGNOSIS — L578 Other skin changes due to chronic exposure to nonionizing radiation: Secondary | ICD-10-CM | POA: Diagnosis not present

## 2018-02-07 DIAGNOSIS — D1801 Hemangioma of skin and subcutaneous tissue: Secondary | ICD-10-CM | POA: Diagnosis not present

## 2018-02-07 DIAGNOSIS — L570 Actinic keratosis: Secondary | ICD-10-CM | POA: Diagnosis not present

## 2018-04-05 DIAGNOSIS — Z23 Encounter for immunization: Secondary | ICD-10-CM | POA: Diagnosis not present

## 2018-04-27 DIAGNOSIS — H2512 Age-related nuclear cataract, left eye: Secondary | ICD-10-CM | POA: Diagnosis not present

## 2018-04-27 DIAGNOSIS — H25812 Combined forms of age-related cataract, left eye: Secondary | ICD-10-CM | POA: Diagnosis not present

## 2018-05-11 DIAGNOSIS — H268 Other specified cataract: Secondary | ICD-10-CM | POA: Diagnosis not present

## 2018-05-11 DIAGNOSIS — H25811 Combined forms of age-related cataract, right eye: Secondary | ICD-10-CM | POA: Diagnosis not present

## 2018-05-11 DIAGNOSIS — H2511 Age-related nuclear cataract, right eye: Secondary | ICD-10-CM | POA: Diagnosis not present

## 2018-05-12 DIAGNOSIS — H2512 Age-related nuclear cataract, left eye: Secondary | ICD-10-CM | POA: Diagnosis not present

## 2018-06-12 ENCOUNTER — Other Ambulatory Visit: Payer: Self-pay

## 2018-06-15 ENCOUNTER — Other Ambulatory Visit: Payer: Self-pay

## 2018-06-15 DIAGNOSIS — R0602 Shortness of breath: Secondary | ICD-10-CM

## 2018-06-15 MED ORDER — FUROSEMIDE 20 MG PO TABS: 20.0000 mg | ORAL_TABLET | Freq: Every day | ORAL | 0 refills | Status: DC

## 2018-06-26 DIAGNOSIS — L209 Atopic dermatitis, unspecified: Secondary | ICD-10-CM | POA: Diagnosis not present

## 2018-06-26 DIAGNOSIS — E785 Hyperlipidemia, unspecified: Secondary | ICD-10-CM | POA: Diagnosis not present

## 2018-06-26 DIAGNOSIS — N183 Chronic kidney disease, stage 3 (moderate): Secondary | ICD-10-CM | POA: Diagnosis not present

## 2018-06-26 DIAGNOSIS — Z125 Encounter for screening for malignant neoplasm of prostate: Secondary | ICD-10-CM | POA: Diagnosis not present

## 2018-06-26 DIAGNOSIS — Z79899 Other long term (current) drug therapy: Secondary | ICD-10-CM | POA: Diagnosis not present

## 2018-06-26 DIAGNOSIS — Z0000 Encounter for general adult medical examination without abnormal findings: Secondary | ICD-10-CM | POA: Diagnosis not present

## 2018-07-04 ENCOUNTER — Other Ambulatory Visit: Payer: Self-pay | Admitting: Cardiology

## 2018-07-07 ENCOUNTER — Other Ambulatory Visit: Payer: Self-pay | Admitting: Cardiology

## 2018-07-07 DIAGNOSIS — R0602 Shortness of breath: Secondary | ICD-10-CM

## 2018-07-17 ENCOUNTER — Other Ambulatory Visit: Payer: Self-pay | Admitting: Cardiology

## 2018-07-17 DIAGNOSIS — R0602 Shortness of breath: Secondary | ICD-10-CM

## 2018-07-17 MED ORDER — FUROSEMIDE 20 MG PO TABS: 20.0000 mg | ORAL_TABLET | Freq: Every day | ORAL | 0 refills | Status: DC

## 2018-07-17 NOTE — Telephone Encounter (Signed)
°*  STAT* If patient is at the pharmacy, call can be transferred to refill team.   1. Which medications need to be refilled? (please list name of each medication and dose if known) Furosemdie 20mg  takes 1 daily   2. Which pharmacy/location (including street and city if local pharmacy) is medication to be sent to?[facility]  3. Do they need a 30 day or 90 day supply? 30  Patient is scheduled for 08/10/2018 with [facility] for follow up/[facility]

## 2018-07-17 NOTE — Telephone Encounter (Signed)
Rx for furosemide sent to [facility] as requested.

## 2018-07-19 ENCOUNTER — Ambulatory Visit: Payer: Medicare HMO | Admitting: Gastroenterology

## 2018-07-19 VITALS — BP 128/66 | HR 61 | Ht 63.5 in | Wt 164.2 lb

## 2018-07-19 DIAGNOSIS — K643 Fourth degree hemorrhoids: Secondary | ICD-10-CM

## 2018-07-19 MED ORDER — FLUTICASONE PROPIONATE 0.05 % EX CREA: TOPICAL_CREAM | Freq: Two times a day (BID) | CUTANEOUS | 2 refills | Status: AC

## 2018-07-19 NOTE — Patient Instructions (Signed)
If you are age 81 or older, your body mass index should be between 23-30. Your Body mass index is 28.64 kg/m. If this is out of the aforementioned range listed, please consider follow up with your Primary Care Provider.  If you are age 67 or younger, your body mass index should be between 19-25. Your Body mass index is 28.64 kg/m. If this is out of the aformentioned range listed, please consider follow up with your Primary Care Provider.   We have sent the following medications to your pharmacy for you to pick up at your convenience: Fluticasone  Decrease ibuprofen to once a day for two weeks and then stop if possible.   Please purchase the following medications over the counter and take as directed: Colace once daily.   Please call Dr. Leland Her nurse Karl Pock, RN)  in 2 weeks at [phone]  to let her now how you are doing.     Thank you,  Dr. Jackquline Denmark

## 2018-07-19 NOTE — Progress Notes (Signed)
Chief Complaint: FU  Referring Provider:  Darryll Capers, NP      ASSESSMENT AND PLAN;   #1. Int hoids. Neg colon 07/2015 except for hemorrhoids.  #2.  H/O hemorrhoidectomy 1990s  Plan: - Colace 1 tab po qd. - Increase water intake. - fluticasone cream 0.05% generic 30g 1 bid PR x 10 days, 2 refill. - Call in 4 weeks. If still with problems, refer to Dr Amalia Hailey for surgical hoidectimy.  - Decreased ibuprofen to 1 qd x 2 weeks, then stop if possible.  HPI:    Scott Singh is a 83 y.o. male  For follow-up visit Has been having problems with hemorrhoids-prolapsing out, and bleeding intermittently especially when he wipes or when he gets constipated. Some rectal discomfort. No definite pain His wife made him come  Denies having any unintentional weight loss.  He has been trying to lose weight and has lost 20 pounds.  No nausea, vomiting, heartburn, regurgitation, odynophagia or dysphagia.  Has been using ibuprofen 600 mg p.o. twice daily  Has used Preparation H without any significant relief.   Past Medical History:  Diagnosis Date  . Asthma   . BPH (benign prostatic hyperplasia)   . Carpal tunnel syndrome   . CKD (chronic kidney disease) stage 2, GFR 60-89 ml/min   . Depression   . GERD (gastroesophageal reflux disease)   . History of TIA (transient ischemic attack)    mini stroke brain stem  . Hyperlipidemia   . Hypertension   . Insomnia   . Migraines   . Prediabetes   . Recurrent major depression ([place])   . Shortness of breath   . Sleep apnea     Past Surgical History:  Procedure Laterality Date  . APPENDECTOMY    . CATARACT EXTRACTION Bilateral 2019  . COLONOSCOPY  08/04/2015   internal and external hemorrhoids. Otherwise normal colonoscopy. No evidence of any active bleeding  . ESOPHAGOGASTRODUODENOSCOPY  10/23/2012   Mild gastritis. Endoscopic finding suggestive of previous peptic ulcer disease   . HEMORROIDECTOMY  1990  . NASAL SEPTUM SURGERY  2015    . REPLACEMENT TOTAL KNEE    . ROTATOR CUFF REPAIR    . shoul surg    . TOTAL HIP ARTHROPLASTY Right 2019   hip and knee are replaced   . TOTAL KNEE ARTHROPLASTY      Family History  Problem Relation Age of Onset  . Other Mother        Viann Fish in a [employer]  . Diabetes Father   . Heart disease Father   . Pancreatic cancer Father   . Heart disease Sister   . Alzheimer's disease Sister   . Colon cancer Neg Hx   . Esophageal cancer Neg Hx     Social History   Tobacco Use  . Smoking status: Former Smoker    Packs/day: 0.50    Years: 10.00    Pack years: 5.00    Types: Cigarettes    Last attempt to quit: 06/21/1968    Years since quitting: 50.1  . Smokeless tobacco: Never Used  . Tobacco comment: quit when you were 29   Substance Use Topics  . Alcohol use: No  . Drug use: No    Current Outpatient Medications  Medication Sig Dispense Refill  . Acetaminophen (TYLENOL PO) Take 500 mg by mouth as needed.    Marland Kitchen amLODipine (NORVASC) 5 MG tablet TAKE 1 TABLET (5 MG TOTAL) DAILY BY MOUTH. 30 tablet 0  .  aspirin EC 81 MG tablet Take 81 mg by mouth daily.    . diphenhydrAMINE (BENADRYL) 25 mg capsule Take 25 mg by mouth 2 (two) times daily.    . Ergocalciferol (VITAMIN D2 PO) Take 50,000 Units by mouth once a week. Only on Saturday's    . furosemide (LASIX) 20 MG tablet Take 1 tablet (20 mg total) by mouth daily. 30 tablet 0  . ibuprofen (ADVIL,MOTRIN) 600 MG tablet Take 1 tablet by mouth as needed.     . metoprolol tartrate (LOPRESSOR) 100 MG tablet Take 100 mg by mouth daily.    . montelukast (SINGULAIR) 10 MG tablet Take 10 mg by mouth daily.  1  . Multiple Vitamin (MULTIVITAMIN) capsule Take 1 capsule by mouth daily.    . niacin 500 MG tablet Take 500 mg by mouth at bedtime.    . Omega-3 Fatty Acids (FISH OIL) 1200 MG CAPS Take 2 capsules by mouth 2 (two) times daily.    Marland Kitchen omeprazole (PRILOSEC) 40 MG capsule Take 1 capsule by mouth daily.  1  . pravastatin  (PRAVACHOL) 40 MG tablet Take 40 mg by mouth daily.  1  . pyridoxine (B-6) 100 MG tablet Take 100 mg by mouth daily.    . ranitidine (ZANTAC) 150 MG capsule Take 150 mg by mouth every evening.    Marland Kitchen telmisartan (MICARDIS) 80 MG tablet Take 1 tablet (80 mg total) daily by mouth. 30 tablet 3  . topiramate (TOPAMAX) 25 MG tablet TAKE 2 TABLETS BY MOUTH IN THE MORNING AND 1 IN THE AFTERNOON 90 tablet 0   No current facility-administered medications for this visit.     Allergies  Allergen Reactions  . Contrast Media [Iodinated Diagnostic Agents]   . Erythromycin   . Sulpho-Lac Medicated Soap [Sulfur]   . Tetracyclines & Related     Review of Systems:  neg     Physical Exam:    BP 128/66   Pulse 61   Ht 5' 3.5" (1.613 m)   Wt 164 lb 4 oz (74.5 kg)   BMI 28.64 kg/m  Filed Weights   07/19/18 1426  Weight: 164 lb 4 oz (74.5 kg)   Constitutional:  Well-developed, in no acute distress. Psychiatric: Normal mood and affect. Behavior is normal. HEENT: Pupils normal.  Conjunctivae are normal. No scleral icterus. Cardiovascular: Normal rate, regular rhythm. No edema Pulmonary/chest: Effort normal and breath sounds normal. No wheezing, rales or rhonchi. Abdominal: Soft, nondistended. Nontender. Bowel sounds active throughout. There are no masses palpable. No hepatomegaly. Rectal: Prolapsed internal hemorrhoids, heme-negative stools.  No definite masses. Neurological: Alert and oriented to person place and time. Skin: Skin is warm and dry. No rashes noted.  Data Reviewed: I have personally reviewed following labs and imaging studies  CBC: No flowsheet data found.  CMP: CMP Latest Ref Rng & Units 03/28/2017  Glucose 65 - 99 mg/dL 99  BUN 8 - 27 mg/dL 20  Creatinine 0.76 - 1.27 mg/dL 1.20  Sodium 134 - 144 mmol/L 142  Potassium 3.5 - 5.2 mmol/L 5.1  Chloride 96 - 106 mmol/L 106  CO2 20 - 29 mmol/L 20  Calcium 8.6 - 10.2 mg/dL 9.6     Carmell Austria, MD 07/19/2018, 2:50 PM  Cc:  Darryll Capers, NP

## 2018-07-21 ENCOUNTER — Telehealth: Payer: Self-pay | Admitting: Gastroenterology

## 2018-07-21 NOTE — Telephone Encounter (Signed)
Pt's wife called requesting a different med than flut[facility]asone because [facility] to [facility].

## 2018-07-21 NOTE — Telephone Encounter (Signed)
I have called and spoke with patients wife, she is going to see if the pharmacy knows if any other pharmacy has it and see if they can send the prescription to them. I have instructed patient that if she needs me to send the prescription somewhere else to just call the office back.

## 2018-07-27 ENCOUNTER — Other Ambulatory Visit: Payer: Self-pay | Admitting: Cardiology

## 2018-07-28 DIAGNOSIS — M5416 Radiculopathy, lumbar region: Secondary | ICD-10-CM | POA: Diagnosis not present

## 2018-08-01 DIAGNOSIS — M5116 Intervertebral disc disorders with radiculopathy, lumbar region: Secondary | ICD-10-CM | POA: Diagnosis not present

## 2018-08-01 DIAGNOSIS — M545 Low back pain: Secondary | ICD-10-CM | POA: Diagnosis not present

## 2018-08-03 ENCOUNTER — Telehealth: Payer: Self-pay | Admitting: Gastroenterology

## 2018-08-03 NOTE — Telephone Encounter (Signed)
PT is calling back 2 weeks after his appt to let us know how he is feeling per Dr.Gupta Request. Patient states that he is feeling good and that everything that the Doctor give him worked.

## 2018-08-04 NOTE — Telephone Encounter (Signed)
RN did not personally contact the patient-please review the previous message- please advise if MD would like further information from patient and a call will go out to the patient;

## 2018-08-07 NOTE — Telephone Encounter (Signed)
Bre Can you please call him-if he is doing okay Just let me know Thanks

## 2018-08-07 NOTE — Telephone Encounter (Signed)
Thanks for letting me know!

## 2018-08-07 NOTE — Telephone Encounter (Signed)
Called and spoke with patient's wife-verified DPR-Sandra-patient's wife reports the patient is "doing great from a GI standpoint-Sandra advised to call back if questions/concerns arise; patient's wife verbalized understanding of information/instructions;

## 2018-08-08 ENCOUNTER — Other Ambulatory Visit: Payer: Self-pay | Admitting: Cardiology

## 2018-08-08 DIAGNOSIS — R0602 Shortness of breath: Secondary | ICD-10-CM

## 2018-08-08 DIAGNOSIS — M5416 Radiculopathy, lumbar region: Secondary | ICD-10-CM | POA: Diagnosis not present

## 2018-08-09 NOTE — Progress Notes (Signed)
Cardiology Office Note:    Date:  08/10/2018   ID:  Scott Singh, DOB [birth year], MRN [id number]  PCP:  Darryll Capers, NP  Cardiologist:  Shirlee More, MD    Referring MD: Darryll Capers, NP [facility]   ASSESSMENT:    1. Essential hypertension    PLAN:    In order of problems listed above:  1. His blood pressure presently is well controlled continue his current medications including low-dose loop diuretic although I think his shortness of breath is not cardiac is related to lung disease exhibited on CT scan and has been seen by pulmonary.  I renewed his prescriptions I will plan to see back in the office as needed   Next appointment: As needed   Medication Adjustments/Labs and Tests Ordered: Current medicines are reviewed at length with the patient today.  Concerns regarding medicines are outlined above.  No orders of the defined types were placed in this encounter.  No orders of the defined types were placed in this encounter.   Chief Complaint  Patient presents with  . Hypertension    History of Present Illness:    Scott Singh is a 83 y.o. male with a hx of hypertension and SOB last seen 05/05/18.  Evaluation showed normal ejection fraction and no findings of diastolic heart failure or significant valvular dysfunction or pulmonary hypertension.  His myocardial perfusion study showed no ischemia EF 65% CT of the chest showed findings of emphysema and granulomatous lung disease and he was referred to pulmonary. Compliance with diet, lifestyle and medications: yes  His home blood pressures were less than 073 systolic he has shortness of breath with more than usual activity but no edema orthopnea chest pain palpitation or syncope and I encouraged him to avoid drugs like Neurontin associated with sodium retention Past Medical History:  Diagnosis Date  . Asthma   . BPH (benign prostatic hyperplasia)   . Carpal tunnel syndrome   . CKD (chronic kidney disease) stage 2, GFR 60-89  ml/min   . Depression   . GERD (gastroesophageal reflux disease)   . History of TIA (transient ischemic attack)    mini stroke brain stem  . Hyperlipidemia   . Hypertension   . Insomnia   . Migraines   . Prediabetes   . Recurrent major depression ([place])   . Shortness of breath   . Sleep apnea     Past Surgical History:  Procedure Laterality Date  . APPENDECTOMY    . CATARACT EXTRACTION Bilateral 2019  . COLONOSCOPY  08/04/2015   internal and external hemorrhoids. Otherwise normal colonoscopy. No evidence of any active bleeding  . ESOPHAGOGASTRODUODENOSCOPY  10/23/2012   Mild gastritis. Endoscopic finding suggestive of previous peptic ulcer disease   . HEMORROIDECTOMY  1990  . NASAL SEPTUM SURGERY  2015  . REPLACEMENT TOTAL KNEE    . ROTATOR CUFF REPAIR    . shoul surg    . TOTAL HIP ARTHROPLASTY Right 2019   hip and knee are replaced   . TOTAL KNEE ARTHROPLASTY      Current Medications: Current Meds  Medication Sig  . Acetaminophen (TYLENOL PO) Take 500 mg by mouth as needed.  Marland Kitchen amLODipine (NORVASC) 5 MG tablet TAKE 1 TABLET BY MOUTH EVERY DAY  . aspirin EC 81 MG tablet Take 81 mg by mouth daily.  . diphenhydrAMINE (BENADRYL) 25 mg capsule Take 25 mg by mouth 2 (two) times daily.  . Ergocalciferol (VITAMIN D2 PO) Take 50,000 Units by mouth  once a week. Only on Saturday's  . furosemide (LASIX) 20 MG tablet Take 1 tablet (20 mg total) by mouth daily.  Marland Kitchen ibuprofen (ADVIL,MOTRIN) 600 MG tablet Take 1 tablet by mouth daily.   . metoprolol succinate (TOPROL-XL) 100 MG 24 hr tablet Take 100 mg by mouth daily.  . montelukast (SINGULAIR) 10 MG tablet Take 10 mg by mouth daily.  . Multiple Vitamin (MULTIVITAMIN) capsule Take 1 capsule by mouth daily.  . niacin 500 MG tablet Take 500 mg by mouth at bedtime.  . Omega-3 Fatty Acids (FISH OIL) 1200 MG CAPS Take 2 capsules by mouth 2 (two) times daily.  Marland Kitchen omeprazole (PRILOSEC) 40 MG capsule Take 1 capsule by mouth daily.  .  pravastatin (PRAVACHOL) 40 MG tablet Take 40 mg by mouth daily.  Marland Kitchen pyridoxine (B-6) 100 MG tablet Take 100 mg by mouth daily.  . ranitidine (ZANTAC) 150 MG capsule Take 150 mg by mouth every evening.  Marland Kitchen telmisartan (MICARDIS) 80 MG tablet Take 1 tablet (80 mg total) daily by mouth.  . topiramate (TOPAMAX) 25 MG tablet TAKE 2 TABLETS BY MOUTH IN THE MORNING AND 1 IN THE AFTERNOON     Allergies:   Contrast media [iodinated diagnostic agents]; Erythromycin; Sulpho-lac medicated soap [sulfur]; and Tetracyclines & related   Social History   Socioeconomic History  . Marital status: Married    Spouse name: Not on file  . Number of children: 2  . Years of education: Not on file  . Highest education level: Not on file  Occupational History  . Not on file  Social Needs  . Financial resource strain: Not on file  . Food insecurity:    Worry: Not on file    Inability: Not on file  . Transportation needs:    Medical: Not on file    Non-medical: Not on file  Tobacco Use  . Smoking status: Former Smoker    Packs/day: 0.50    Years: 10.00    Pack years: 5.00    Types: Cigarettes    Last attempt to quit: 06/21/1968    Years since quitting: 50.1  . Smokeless tobacco: Never Used  . Tobacco comment: quit when you were 29   Substance and Sexual Activity  . Alcohol use: No  . Drug use: No  . Sexual activity: Not on file  Lifestyle  . Physical activity:    Days per week: Not on file    Minutes per session: Not on file  . Stress: Not on file  Relationships  . Social connections:    Talks on phone: Not on file    Gets together: Not on file    Attends religious service: Not on file    Active member of club or organization: Not on file    Attends meetings of clubs or organizations: Not on file    Relationship status: Not on file  Other Topics Concern  . Not on file  Social History Narrative  . Not on file     Family History: The patient's family history includes Alzheimer's disease in  his sister; Diabetes in his father; Heart disease in his father and sister; Other in his mother; Pancreatic cancer in his father. There is no history of Colon cancer or Esophageal cancer. ROS:   Please see the history of present illness.    All other systems reviewed and are negative.  EKGs/Labs/Other Studies Reviewed:    The following studies were reviewed today:  EKG:  EKG ordered today.  The ekg ordered today demonstrates [place] normal  Recent Labs:   Chol172 HDL 32 LDL 105 Cr 1.2 No results found for requested labs within last 8760 hours.  Recent Lipid Panel No results found for: CHOL, TRIG, HDL, CHOLHDL, VLDL, LDLCALC, LDLDIRECT  Physical Exam:    VS:  BP 136/76 (BP Location: Right Arm, Patient Position: Sitting, Cuff Size: Normal)   Pulse (!) 56   Ht 5' 3.5" (1.613 m)   Wt 162 lb 9.6 oz (73.8 kg)   SpO2 98%   BMI 28.35 kg/m     Wt Readings from Last 3 Encounters:  08/10/18 162 lb 9.6 oz (73.8 kg)  07/19/18 164 lb 4 oz (74.5 kg)  05/18/17 186 lb (84.4 kg)     GEN:  Well nourished, well developed in no acute distress HEENT: Normal NECK: No JVD; No carotid bruits LYMPHATICS: No lymphadenopathy CARDIAC: RRR, no murmurs, rubs, gallops RESPIRATORY:  Clear to auscultation without rales, wheezing or rhonchi  ABDOMEN: Soft, non-tender, non-distended MUSCULOSKELETAL:  No edema; No deformity  SKIN: Warm and dry NEUROLOGIC:  Alert and oriented x 3 PSYCHIATRIC:  Normal affect    Signed, Shirlee More, MD  08/10/2018 11:34 AM    [place]

## 2018-08-10 ENCOUNTER — Ambulatory Visit: Payer: Medicare HMO | Admitting: Cardiology

## 2018-08-10 ENCOUNTER — Encounter: Payer: Self-pay | Admitting: Cardiology

## 2018-08-10 VITALS — BP 136/76 | HR 56 | Ht 63.5 in | Wt 162.6 lb

## 2018-08-10 DIAGNOSIS — R0602 Shortness of breath: Secondary | ICD-10-CM | POA: Diagnosis not present

## 2018-08-10 DIAGNOSIS — I10 Essential (primary) hypertension: Secondary | ICD-10-CM

## 2018-08-10 MED ORDER — FUROSEMIDE 20 MG PO TABS: 20.0000 mg | ORAL_TABLET | Freq: Every day | ORAL | 11 refills | Status: DC

## 2018-08-10 NOTE — Patient Instructions (Signed)
Medication Instructions:  Your physician recommends that you continue on your current medications as directed. Please refer to the Current Medication list given to you today.  If you need a refill on your cardiac medications before your next appointment, please call your pharmacy.   Lab work: NONE  Testing/Procedures: NONE   Follow-Up: As needed with Dr. Bettina Gavia in the [place] office

## 2018-08-10 NOTE — Addendum Note (Signed)
Addended by: Beckey Rutter on: 08/10/2018 11:49 AM   Modules accepted: Orders

## 2018-08-19 ENCOUNTER — Other Ambulatory Visit: Payer: Self-pay | Admitting: Cardiology

## 2018-09-15 ENCOUNTER — Other Ambulatory Visit: Payer: Self-pay | Admitting: Cardiology

## 2018-10-07 IMAGING — NM NM MISC PROCEDURE
6 series · 36 of 36 positions shown · non-contrast
Comparison: none

[Series 1: rest · 6.51mm/px · 6 of 64 frames shown]
[frame 6/64]
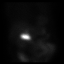
[frame 16/64]
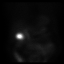
[frame 27/64]
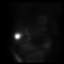
[frame 38/64]
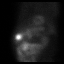
[frame 48/64]
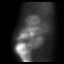
[frame 59/64]
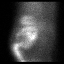

[Series 1: wbr_r-proj_st rest · 6.51mm/px · 6 of 64 frames shown]
[frame 6/64]
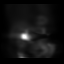
[frame 16/64]
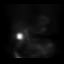
[frame 27/64]
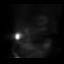
[frame 38/64]
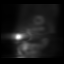
[frame 48/64]
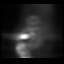
[frame 59/64]
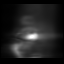

[Series 2: stress · 6.51mm/px · 6 of 512 frames shown (1 of 2)]
[frame 43/512]
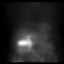
[frame 128/512]
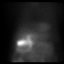
[frame 214/512]
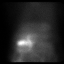
[frame 299/512]
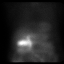
[frame 384/512]
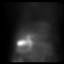
[frame 470/512]
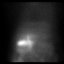

[Series 2: stress · 6.51mm/px · 6 of 64 frames shown (2 of 2)]
[frame 6/64]
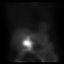
[frame 16/64]
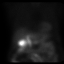
[frame 27/64]
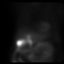
[frame 38/64]
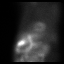
[frame 48/64]
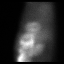
[frame 59/64]
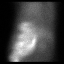

[Series 2: wbr_s-proj_st stress · 6.51mm/px · 6 of 512 frames shown (1 of 2)]
[frame 43/512]
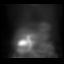
[frame 128/512]
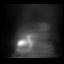
[frame 214/512]
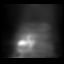
[frame 299/512]
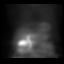
[frame 384/512]
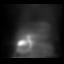
[frame 470/512]
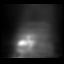

[Series 2: wbr_s-proj_st stress · 6.51mm/px · 6 of 64 frames shown (2 of 2)]
[frame 6/64]
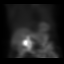
[frame 16/64]
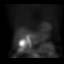
[frame 27/64]
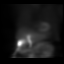
[frame 38/64]
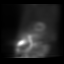
[frame 48/64]
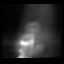
[frame 59/64]
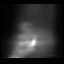

[36 of 36 positions shown; findings below may reference images not displayed]

Canned report from images found in remote index.

Refer to host system for actual result text.

## 2018-10-08 ENCOUNTER — Other Ambulatory Visit: Payer: Self-pay | Admitting: Cardiology

## 2018-10-09 NOTE — Telephone Encounter (Signed)
[facility]mlodipine sent to [facility] in [facility]

## 2018-11-21 DIAGNOSIS — E559 Vitamin D deficiency, unspecified: Secondary | ICD-10-CM | POA: Diagnosis not present

## 2018-11-21 DIAGNOSIS — R7301 Impaired fasting glucose: Secondary | ICD-10-CM | POA: Diagnosis not present

## 2018-11-21 DIAGNOSIS — I1310 Hypertensive heart and chronic kidney disease without heart failure, with stage 1 through stage 4 chronic kidney disease, or unspecified chronic kidney disease: Secondary | ICD-10-CM | POA: Diagnosis not present

## 2018-11-21 DIAGNOSIS — Z6827 Body mass index (BMI) 27.0-27.9, adult: Secondary | ICD-10-CM | POA: Diagnosis not present

## 2018-11-21 DIAGNOSIS — E785 Hyperlipidemia, unspecified: Secondary | ICD-10-CM | POA: Diagnosis not present

## 2018-11-21 DIAGNOSIS — Z79899 Other long term (current) drug therapy: Secondary | ICD-10-CM | POA: Diagnosis not present

## 2018-11-21 DIAGNOSIS — N183 Chronic kidney disease, stage 3 (moderate): Secondary | ICD-10-CM | POA: Diagnosis not present

## 2019-01-01 ENCOUNTER — Other Ambulatory Visit: Payer: Self-pay | Admitting: Cardiology

## 2019-01-30 DIAGNOSIS — R1032 Left lower quadrant pain: Secondary | ICD-10-CM | POA: Diagnosis not present

## 2019-01-30 DIAGNOSIS — H8110 Benign paroxysmal vertigo, unspecified ear: Secondary | ICD-10-CM | POA: Diagnosis not present

## 2019-01-30 DIAGNOSIS — H579 Unspecified disorder of eye and adnexa: Secondary | ICD-10-CM | POA: Diagnosis not present

## 2019-01-30 DIAGNOSIS — R200 Anesthesia of skin: Secondary | ICD-10-CM | POA: Diagnosis not present

## 2019-01-30 DIAGNOSIS — R202 Paresthesia of skin: Secondary | ICD-10-CM | POA: Diagnosis not present

## 2019-01-31 DIAGNOSIS — R1032 Left lower quadrant pain: Secondary | ICD-10-CM | POA: Diagnosis not present

## 2019-01-31 DIAGNOSIS — N200 Calculus of kidney: Secondary | ICD-10-CM | POA: Diagnosis not present

## 2019-04-04 DIAGNOSIS — Z23 Encounter for immunization: Secondary | ICD-10-CM | POA: Diagnosis not present

## 2019-04-28 ENCOUNTER — Other Ambulatory Visit: Payer: Self-pay | Admitting: Cardiology

## 2019-07-31 ENCOUNTER — Other Ambulatory Visit: Payer: Self-pay | Admitting: Cardiology

## 2019-07-31 DIAGNOSIS — R0602 Shortness of breath: Secondary | ICD-10-CM

## 2019-08-20 DIAGNOSIS — Z6827 Body mass index (BMI) 27.0-27.9, adult: Secondary | ICD-10-CM | POA: Diagnosis not present

## 2019-08-20 DIAGNOSIS — R195 Other fecal abnormalities: Secondary | ICD-10-CM | POA: Diagnosis not present

## 2019-08-20 DIAGNOSIS — R634 Abnormal weight loss: Secondary | ICD-10-CM | POA: Diagnosis not present

## 2019-08-20 DIAGNOSIS — Z0000 Encounter for general adult medical examination without abnormal findings: Secondary | ICD-10-CM | POA: Diagnosis not present

## 2019-08-20 DIAGNOSIS — I1310 Hypertensive heart and chronic kidney disease without heart failure, with stage 1 through stage 4 chronic kidney disease, or unspecified chronic kidney disease: Secondary | ICD-10-CM | POA: Diagnosis not present

## 2019-08-20 DIAGNOSIS — R69 Illness, unspecified: Secondary | ICD-10-CM | POA: Diagnosis not present

## 2019-08-20 DIAGNOSIS — K219 Gastro-esophageal reflux disease without esophagitis: Secondary | ICD-10-CM | POA: Diagnosis not present

## 2019-08-20 DIAGNOSIS — Z9181 History of falling: Secondary | ICD-10-CM | POA: Diagnosis not present

## 2019-08-20 DIAGNOSIS — N183 Chronic kidney disease, stage 3 unspecified: Secondary | ICD-10-CM | POA: Diagnosis not present

## 2019-08-20 DIAGNOSIS — R7303 Prediabetes: Secondary | ICD-10-CM | POA: Diagnosis not present

## 2019-09-05 DIAGNOSIS — Z79899 Other long term (current) drug therapy: Secondary | ICD-10-CM | POA: Diagnosis not present

## 2019-09-05 DIAGNOSIS — R7303 Prediabetes: Secondary | ICD-10-CM | POA: Diagnosis not present

## 2019-09-05 DIAGNOSIS — E785 Hyperlipidemia, unspecified: Secondary | ICD-10-CM | POA: Diagnosis not present

## 2019-09-06 DIAGNOSIS — Z1159 Encounter for screening for other viral diseases: Secondary | ICD-10-CM | POA: Diagnosis not present

## 2019-09-13 DIAGNOSIS — D649 Anemia, unspecified: Secondary | ICD-10-CM | POA: Diagnosis not present

## 2019-09-28 DIAGNOSIS — H26492 Other secondary cataract, left eye: Secondary | ICD-10-CM | POA: Diagnosis not present

## 2019-09-28 DIAGNOSIS — H16223 Keratoconjunctivitis sicca, not specified as Sjogren's, bilateral: Secondary | ICD-10-CM | POA: Diagnosis not present

## 2019-09-28 DIAGNOSIS — Z961 Presence of intraocular lens: Secondary | ICD-10-CM | POA: Diagnosis not present

## 2019-10-03 ENCOUNTER — Other Ambulatory Visit: Payer: Self-pay

## 2019-10-03 ENCOUNTER — Ambulatory Visit: Payer: Medicare HMO | Admitting: Gastroenterology

## 2019-10-03 ENCOUNTER — Encounter: Payer: Self-pay | Admitting: Gastroenterology

## 2019-10-03 VITALS — BP 122/76 | HR 64 | Temp 97.5°F | Ht 62.0 in | Wt 157.0 lb

## 2019-10-03 DIAGNOSIS — K625 Hemorrhage of anus and rectum: Secondary | ICD-10-CM

## 2019-10-03 NOTE — Progress Notes (Signed)
Chief Complaint: FU  Referring Provider:  Elenore Paddy, NP      ASSESSMENT AND PLAN;   #1. Rectal bleeding, likely d/t Int hoids. R/O other causes.  Neg colon 07/2015 except for hemorrhoids.  #2.  H/O hemorrhoidectomy 1990s  Plan: - I think will be reasonable to proceed with colon (June mid, when he returns from [place]).  I have explained him the risks and benefits.  He wishes to proceed. - Colace 1 tab po qd. - Increase water intake. - Continue fluticasone cream 0.05% generic 30g 1 bid PR x 10 days, 2 refill. - CBC, CMP @ time of colon.  HPI:    Scott Singh is a 84 y.o. male  For follow-up visit Feels better from hemorrhoids standpoint on fluticasone Has been having intermittent rectal bleeding.  He has been advised to get colonoscopy performed.  He is going to [place] to visit his family.  Will return in June.  Would like to get colonoscopy done in mid June.  No fever chills or night sweats.  No recent weight loss.  Denies having any upper GI symptoms including nausea, vomiting, heartburn, odynophagia or dysphagia.  Physically very active.   Past Medical History:  Diagnosis Date  . Asthma   . BPH (benign prostatic hyperplasia)   . Carpal tunnel syndrome   . CKD (chronic kidney disease) stage 2, GFR 60-89 ml/min   . Depression   . GERD (gastroesophageal reflux disease)   . History of TIA (transient ischemic attack)    mini stroke brain stem  . Hyperlipidemia   . Hypertension   . Insomnia   . Migraines   . Prediabetes   . Recurrent major depression ([place])   . Shortness of breath   . Sleep apnea     Past Surgical History:  Procedure Laterality Date  . APPENDECTOMY    . CATARACT EXTRACTION Bilateral 2019  . COLONOSCOPY  08/04/2015   internal and external hemorrhoids. Otherwise normal colonoscopy. No evidence of any active bleeding  . ESOPHAGOGASTRODUODENOSCOPY  10/23/2012   Mild gastritis. Endoscopic finding suggestive of previous peptic ulcer disease   .  HEMORROIDECTOMY  1990  . NASAL SEPTUM SURGERY  2015  . REPLACEMENT TOTAL KNEE    . ROTATOR CUFF REPAIR    . shoul surg    . TOTAL HIP ARTHROPLASTY Right 2019   hip and knee are replaced   . TOTAL KNEE ARTHROPLASTY      Family History  Problem Relation Age of Onset  . Other Mother        Viann Fish in a [employer]  . Diabetes Father   . Heart disease Father   . Pancreatic cancer Father   . Heart disease Sister   . Alzheimer's disease Sister   . Colon cancer Neg Hx   . Esophageal cancer Neg Hx     Social History   Tobacco Use  . Smoking status: Former Smoker    Packs/day: 0.50    Years: 10.00    Pack years: 5.00    Types: Cigarettes    Quit date: 06/21/1968    Years since quitting: 51.3  . Smokeless tobacco: Never Used  . Tobacco comment: quit when you were 29   Substance Use Topics  . Alcohol use: No  . Drug use: No    Current Outpatient Medications  Medication Sig Dispense Refill  . Acetaminophen (TYLENOL PO) Take 500 mg by mouth as needed.    Marland Kitchen amLODipine (NORVASC) 5 MG  tablet TAKE 1 TABLET BY MOUTH EVERY DAY 90 tablet 1  . aspirin EC 81 MG tablet Take 81 mg by mouth daily.    . diphenhydrAMINE (BENADRYL) 25 mg capsule Take 25 mg by mouth 2 (two) times daily.    . Ergocalciferol (VITAMIN D2 PO) Take 50,000 Units by mouth once a week. Only on Saturday's    . furosemide (LASIX) 20 MG tablet Take 1 tablet (20 mg total) by mouth daily. Please call to schedule yearly appt for future refills. 90 tablet 0  . ibuprofen (ADVIL,MOTRIN) 600 MG tablet Take 1 tablet by mouth daily.     . metoprolol succinate (TOPROL-XL) 100 MG 24 hr tablet Take 100 mg by mouth daily.    . montelukast (SINGULAIR) 10 MG tablet Take 10 mg by mouth daily.  1  . Multiple Vitamin (MULTIVITAMIN) capsule Take 1 capsule by mouth daily.    . niacin 500 MG tablet Take 500 mg by mouth at bedtime.    . Omega-3 Fatty Acids (FISH OIL) 1200 MG CAPS Take 2 capsules by mouth 2 (two) times daily.    Marland Kitchen  omeprazole (PRILOSEC) 40 MG capsule Take 1 capsule by mouth daily.  1  . pravastatin (PRAVACHOL) 40 MG tablet Take 40 mg by mouth daily.  1  . pyridoxine (B-6) 100 MG tablet Take 100 mg by mouth daily.    . ranitidine (ZANTAC) 150 MG capsule Take 150 mg by mouth every evening.    Marland Kitchen telmisartan (MICARDIS) 80 MG tablet Take 1 tablet (80 mg total) daily by mouth. 30 tablet 3  . topiramate (TOPAMAX) 25 MG tablet TAKE 2 TABLETS BY MOUTH IN THE MORNING AND 1 IN THE AFTERNOON 90 tablet 0   No current facility-administered medications for this visit.    Allergies  Allergen Reactions  . Contrast Media [Iodinated Diagnostic Agents] Rash  . Erythromycin Rash  . Sulpho-Lac Medicated Soap [Sulfur] Rash  . Tetracyclines & Related Rash    Review of Systems:  neg     Physical Exam:    BP 122/76   Pulse 64   Temp (!) 97.5 F (36.4 C)   Ht 5\' 2"  (1.575 m)   Wt 157 lb (71.2 kg)   BMI 28.72 kg/m  Filed Weights   10/03/19 1115  Weight: 157 lb (71.2 kg)   Constitutional:  Well-developed, in no acute distress. Psychiatric: Normal mood and affect. Behavior is normal. HEENT: Pupils normal.  Conjunctivae are normal. No scleral icterus. Cardiovascular: Normal rate, regular rhythm. No edema Pulmonary/chest: Effort normal and breath sounds normal. No wheezing, rales or rhonchi. Abdominal: Soft, nondistended. Nontender. Bowel sounds active throughout. There are no masses palpable. No hepatomegaly. Rectal: Not performed today.  Will be performed at the time of colonoscopy. Neurological: Alert and oriented to person place and time. Skin: Skin is warm and dry. No rashes noted.  Data Reviewed: I have personally reviewed following labs and imaging studies  CBC: No flowsheet data found.  CMP: CMP Latest Ref Rng & Units 03/28/2017  Glucose 65 - 99 mg/dL 99  BUN 8 - 27 mg/dL 20  Creatinine 0.76 - 1.27 mg/dL 1.20  Sodium 134 - 144 mmol/L 142  Potassium 3.5 - 5.2 mmol/L 5.1  Chloride 96 - 106  mmol/L 106  CO2 20 - 29 mmol/L 20  Calcium 8.6 - 10.2 mg/dL 9.6     Carmell Austria, MD 10/03/2019, 11:36 AM  Cc: Elenore Paddy, NP

## 2019-10-03 NOTE — Patient Instructions (Addendum)
If you are age 82 or older, your body mass index should be between 23-30. Your Body mass index is 28.72 kg/m. If this is out of the aforementioned range listed, please consider follow up with your Primary Care Provider.  If you are age 35 or younger, your body mass index should be between 19-25. Your Body mass index is 28.72 kg/m. If this is out of the aformentioned range listed, please consider follow up with your Primary Care Provider.   It has been recommended to you by your physician that you have a(n) Colonoscopy completed. Per your request, we did not schedule the procedure(s) today. Please contact our office at [phone] should you decide to have the procedure completed. You will be scheduled for a pre-visit and procedure at that time.   Please go to the lab at [facility] ([place] You will need to go to level "B", you do not need an appointment for this. Hours available are 7:30 am - 4:30 pm.  You can have this done on the day of your procedure.   Thank you,  Dr. Jackquline Denmark

## 2019-10-22 ENCOUNTER — Other Ambulatory Visit: Payer: Self-pay | Admitting: Family

## 2019-10-24 ENCOUNTER — Other Ambulatory Visit: Payer: Self-pay | Admitting: Cardiology

## 2019-10-24 DIAGNOSIS — R0602 Shortness of breath: Secondary | ICD-10-CM

## 2019-11-15 ENCOUNTER — Other Ambulatory Visit: Payer: Self-pay | Admitting: Cardiology

## 2020-02-26 DIAGNOSIS — M542 Cervicalgia: Secondary | ICD-10-CM | POA: Diagnosis not present

## 2020-02-26 DIAGNOSIS — Z6828 Body mass index (BMI) 28.0-28.9, adult: Secondary | ICD-10-CM | POA: Diagnosis not present

## 2020-02-26 DIAGNOSIS — B372 Candidiasis of skin and nail: Secondary | ICD-10-CM | POA: Diagnosis not present

## 2020-02-29 DIAGNOSIS — E785 Hyperlipidemia, unspecified: Secondary | ICD-10-CM | POA: Diagnosis not present

## 2020-02-29 DIAGNOSIS — Z8639 Personal history of other endocrine, nutritional and metabolic disease: Secondary | ICD-10-CM | POA: Diagnosis not present

## 2020-02-29 DIAGNOSIS — Z6828 Body mass index (BMI) 28.0-28.9, adult: Secondary | ICD-10-CM | POA: Diagnosis not present

## 2020-02-29 DIAGNOSIS — E559 Vitamin D deficiency, unspecified: Secondary | ICD-10-CM | POA: Diagnosis not present

## 2020-02-29 DIAGNOSIS — R7301 Impaired fasting glucose: Secondary | ICD-10-CM | POA: Diagnosis not present

## 2020-02-29 DIAGNOSIS — M542 Cervicalgia: Secondary | ICD-10-CM | POA: Diagnosis not present

## 2020-03-13 DIAGNOSIS — R338 Other retention of urine: Secondary | ICD-10-CM | POA: Diagnosis not present

## 2020-03-13 DIAGNOSIS — N1339 Other hydronephrosis: Secondary | ICD-10-CM | POA: Diagnosis not present

## 2020-03-13 DIAGNOSIS — R319 Hematuria, unspecified: Secondary | ICD-10-CM | POA: Diagnosis not present

## 2020-03-13 DIAGNOSIS — I700 Atherosclerosis of aorta: Secondary | ICD-10-CM | POA: Diagnosis not present

## 2020-03-13 DIAGNOSIS — N132 Hydronephrosis with renal and ureteral calculous obstruction: Secondary | ICD-10-CM | POA: Diagnosis not present

## 2020-03-13 DIAGNOSIS — N201 Calculus of ureter: Secondary | ICD-10-CM | POA: Diagnosis not present

## 2020-03-13 DIAGNOSIS — D7389 Other diseases of spleen: Secondary | ICD-10-CM | POA: Diagnosis not present

## 2020-03-13 DIAGNOSIS — Z1211 Encounter for screening for malignant neoplasm of colon: Secondary | ICD-10-CM | POA: Diagnosis not present

## 2020-03-14 DIAGNOSIS — M5412 Radiculopathy, cervical region: Secondary | ICD-10-CM | POA: Diagnosis not present

## 2020-03-14 DIAGNOSIS — R413 Other amnesia: Secondary | ICD-10-CM | POA: Diagnosis not present

## 2020-03-14 DIAGNOSIS — R0689 Other abnormalities of breathing: Secondary | ICD-10-CM | POA: Diagnosis not present

## 2020-03-14 DIAGNOSIS — Z6827 Body mass index (BMI) 27.0-27.9, adult: Secondary | ICD-10-CM | POA: Diagnosis not present

## 2020-03-14 DIAGNOSIS — R69 Illness, unspecified: Secondary | ICD-10-CM | POA: Diagnosis not present

## 2020-03-14 DIAGNOSIS — N200 Calculus of kidney: Secondary | ICD-10-CM | POA: Diagnosis not present

## 2020-03-19 DIAGNOSIS — N201 Calculus of ureter: Secondary | ICD-10-CM | POA: Diagnosis not present

## 2020-03-19 DIAGNOSIS — N200 Calculus of kidney: Secondary | ICD-10-CM | POA: Diagnosis not present

## 2020-03-19 DIAGNOSIS — Z87442 Personal history of urinary calculi: Secondary | ICD-10-CM | POA: Diagnosis not present

## 2020-03-27 DIAGNOSIS — M542 Cervicalgia: Secondary | ICD-10-CM | POA: Diagnosis not present

## 2020-03-27 DIAGNOSIS — M47812 Spondylosis without myelopathy or radiculopathy, cervical region: Secondary | ICD-10-CM | POA: Diagnosis not present

## 2020-05-31 ENCOUNTER — Other Ambulatory Visit: Payer: Self-pay | Admitting: Cardiology

## 2020-06-30 DIAGNOSIS — Z23 Encounter for immunization: Secondary | ICD-10-CM | POA: Diagnosis not present

## 2020-07-23 DIAGNOSIS — E785 Hyperlipidemia, unspecified: Secondary | ICD-10-CM | POA: Diagnosis not present

## 2020-07-23 DIAGNOSIS — Z6827 Body mass index (BMI) 27.0-27.9, adult: Secondary | ICD-10-CM | POA: Diagnosis not present

## 2020-07-23 DIAGNOSIS — M65332 Trigger finger, left middle finger: Secondary | ICD-10-CM | POA: Diagnosis not present

## 2020-07-23 DIAGNOSIS — D649 Anemia, unspecified: Secondary | ICD-10-CM | POA: Diagnosis not present

## 2020-07-23 DIAGNOSIS — E559 Vitamin D deficiency, unspecified: Secondary | ICD-10-CM | POA: Diagnosis not present

## 2020-07-23 DIAGNOSIS — Z79899 Other long term (current) drug therapy: Secondary | ICD-10-CM | POA: Diagnosis not present

## 2020-07-23 DIAGNOSIS — M79642 Pain in left hand: Secondary | ICD-10-CM | POA: Diagnosis not present

## 2020-08-26 ENCOUNTER — Other Ambulatory Visit: Payer: Self-pay | Admitting: Cardiology

## 2020-08-26 DIAGNOSIS — R0602 Shortness of breath: Secondary | ICD-10-CM

## 2020-10-15 DIAGNOSIS — Z125 Encounter for screening for malignant neoplasm of prostate: Secondary | ICD-10-CM | POA: Diagnosis not present

## 2020-10-15 DIAGNOSIS — R1031 Right lower quadrant pain: Secondary | ICD-10-CM | POA: Diagnosis not present

## 2020-10-15 DIAGNOSIS — N183 Chronic kidney disease, stage 3 unspecified: Secondary | ICD-10-CM | POA: Diagnosis not present

## 2020-10-15 DIAGNOSIS — R7301 Impaired fasting glucose: Secondary | ICD-10-CM | POA: Diagnosis not present

## 2020-10-15 DIAGNOSIS — Z0000 Encounter for general adult medical examination without abnormal findings: Secondary | ICD-10-CM | POA: Diagnosis not present

## 2020-10-15 DIAGNOSIS — D649 Anemia, unspecified: Secondary | ICD-10-CM | POA: Diagnosis not present

## 2020-10-15 DIAGNOSIS — Z79899 Other long term (current) drug therapy: Secondary | ICD-10-CM | POA: Diagnosis not present

## 2020-10-15 DIAGNOSIS — E785 Hyperlipidemia, unspecified: Secondary | ICD-10-CM | POA: Diagnosis not present

## 2020-10-15 DIAGNOSIS — I1310 Hypertensive heart and chronic kidney disease without heart failure, with stage 1 through stage 4 chronic kidney disease, or unspecified chronic kidney disease: Secondary | ICD-10-CM | POA: Diagnosis not present

## 2020-10-15 DIAGNOSIS — B372 Candidiasis of skin and nail: Secondary | ICD-10-CM | POA: Diagnosis not present

## 2020-10-22 DIAGNOSIS — D492 Neoplasm of unspecified behavior of bone, soft tissue, and skin: Secondary | ICD-10-CM | POA: Diagnosis not present

## 2020-10-22 DIAGNOSIS — H16223 Keratoconjunctivitis sicca, not specified as Sjogren's, bilateral: Secondary | ICD-10-CM | POA: Diagnosis not present

## 2020-10-22 DIAGNOSIS — Z961 Presence of intraocular lens: Secondary | ICD-10-CM | POA: Diagnosis not present

## 2020-10-31 DIAGNOSIS — N200 Calculus of kidney: Secondary | ICD-10-CM | POA: Diagnosis not present

## 2020-10-31 DIAGNOSIS — K753 Granulomatous hepatitis, not elsewhere classified: Secondary | ICD-10-CM | POA: Diagnosis not present

## 2020-10-31 DIAGNOSIS — K5750 Diverticulosis of both small and large intestine without perforation or abscess without bleeding: Secondary | ICD-10-CM | POA: Diagnosis not present

## 2020-10-31 DIAGNOSIS — K429 Umbilical hernia without obstruction or gangrene: Secondary | ICD-10-CM | POA: Diagnosis not present

## 2020-10-31 DIAGNOSIS — Z87442 Personal history of urinary calculi: Secondary | ICD-10-CM | POA: Diagnosis not present

## 2020-10-31 DIAGNOSIS — K6289 Other specified diseases of anus and rectum: Secondary | ICD-10-CM | POA: Diagnosis not present

## 2020-11-05 DIAGNOSIS — D509 Iron deficiency anemia, unspecified: Secondary | ICD-10-CM | POA: Diagnosis not present

## 2020-11-05 DIAGNOSIS — K6289 Other specified diseases of anus and rectum: Secondary | ICD-10-CM | POA: Diagnosis not present

## 2020-11-05 DIAGNOSIS — R918 Other nonspecific abnormal finding of lung field: Secondary | ICD-10-CM | POA: Diagnosis not present

## 2021-01-23 ENCOUNTER — Encounter: Payer: Self-pay | Admitting: Gastroenterology

## 2021-01-23 ENCOUNTER — Other Ambulatory Visit: Payer: Self-pay

## 2021-01-23 ENCOUNTER — Ambulatory Visit (INDEPENDENT_AMBULATORY_CARE_PROVIDER_SITE_OTHER): Payer: Medicare HMO | Admitting: Gastroenterology

## 2021-01-23 ENCOUNTER — Other Ambulatory Visit (INDEPENDENT_AMBULATORY_CARE_PROVIDER_SITE_OTHER): Payer: Medicare HMO

## 2021-01-23 VITALS — BP 138/76 | HR 58 | Wt 151.2 lb

## 2021-01-23 DIAGNOSIS — Z9889 Other specified postprocedural states: Secondary | ICD-10-CM

## 2021-01-23 DIAGNOSIS — D509 Iron deficiency anemia, unspecified: Secondary | ICD-10-CM

## 2021-01-23 DIAGNOSIS — K625 Hemorrhage of anus and rectum: Secondary | ICD-10-CM | POA: Diagnosis not present

## 2021-01-23 LAB — CBC WITH DIFFERENTIAL/PLATELET
Basophils Absolute: 0.1 10*3/uL (ref 0.0–0.1)
Basophils Relative: 0.9 % (ref 0.0–3.0)
Eosinophils Absolute: 0.5 10*3/uL (ref 0.0–0.7)
Eosinophils Relative: 6.5 % — ABNORMAL HIGH (ref 0.0–5.0)
HCT: 39.7 % (ref 39.0–52.0)
Hemoglobin: 13.1 g/dL (ref 13.0–17.0)
Lymphocytes Relative: 35.5 % (ref 12.0–46.0)
Lymphs Abs: 2.8 10*3/uL (ref 0.7–4.0)
MCHC: 33.1 g/dL (ref 30.0–36.0)
MCV: 90.5 fl (ref 78.0–100.0)
Monocytes Absolute: 0.9 10*3/uL (ref 0.1–1.0)
Monocytes Relative: 11.4 % (ref 3.0–12.0)
Neutro Abs: 3.6 10*3/uL (ref 1.4–7.7)
Neutrophils Relative %: 45.7 % (ref 43.0–77.0)
Platelets: 180 10*3/uL (ref 150.0–400.0)
RBC: 4.38 Mil/uL (ref 4.22–5.81)
RDW: 13.8 % (ref 11.5–15.5)
WBC: 7.8 10*3/uL (ref 4.0–10.5)

## 2021-01-23 LAB — COMPREHENSIVE METABOLIC PANEL
ALT: 17 U/L (ref 0–53)
AST: 21 U/L (ref 0–37)
Albumin: 4.3 g/dL (ref 3.5–5.2)
Alkaline Phosphatase: 69 U/L (ref 39–117)
BUN: 21 mg/dL (ref 6–23)
CO2: 27 mEq/L (ref 19–32)
Calcium: 10 mg/dL (ref 8.4–10.5)
Chloride: 106 mEq/L (ref 96–112)
Creatinine, Ser: 1.27 mg/dL (ref 0.40–1.50)
GFR: 52.36 mL/min — ABNORMAL LOW (ref >60.00)
Glucose, Bld: 94 mg/dL (ref 70–99)
Potassium: 5.1 mEq/L (ref 3.5–5.1)
Sodium: 138 mEq/L (ref 135–145)
Total Bilirubin: 0.4 mg/dL (ref 0.2–1.2)
Total Protein: 7.5 g/dL (ref 6.0–8.3)

## 2021-01-23 NOTE — Progress Notes (Signed)
Chief Complaint: FU  Referring Provider:  Elenore Paddy, NP      ASSESSMENT AND PLAN;   #1. Rectal bleeding, likely d/t Int hoids. R/O other causes.  Neg colon 07/2015 except for hemorrhoids. CT urogram showing rectal wall thickening 10/2020.  #2.  H/O hemorrhoidectomy 1990s  #3. IDA. Hb 12.4 MCV 87 09/2020.  Plan: - I think will be reasonable to proceed with colon.  I have explained him the risks and benefits.  He wishes to proceed. - Colace 1 tab po qd. - Increase water intake. - Continue fluticasone cream 0.05% generic 30g 1 bid PR x 10 days, 2 refill. - CBC, CMP today.  HPI:    Scott Singh is a 85 y.o. male  For follow-up visit Still with rectal problems Has been having intermittent rectal bleeding.  He has been advised to get colonoscopy performed. NCCT as above showed rectal wall thickening 10/2020 Occ constipation but doing better with stool softner  Went to [place] to visit his family.  Went fishing and boat got stuck, 1-1/2 miles in the sea.  Had to be drawn to shore just like [place]  No fever chills or night sweats.  No recent weight loss.  Denies having any upper GI symptoms including nausea, vomiting, heartburn, odynophagia or dysphagia.  Physically very active.  Most recent hemoglobin was 12.4 with MCV 87 on 10/15/2020.  His creatinine was 1.3 (normal).  Iron studies showed iron saturation 11%, iron 33 and TIBC 303.  CT urogram 10/2020: -Thickening of rectum ?procititis Past Medical History:  Diagnosis Date   Asthma    BPH (benign prostatic hyperplasia)    Carpal tunnel syndrome    CKD (chronic kidney disease) stage 2, GFR 60-89 ml/min    Depression    GERD (gastroesophageal reflux disease)    History of TIA (transient ischemic attack)    mini stroke brain stem   Hyperlipidemia    Hypertension    Insomnia    Migraines    Prediabetes    Recurrent major depression (HCC)    Shortness of breath    Sleep apnea     Past Surgical History:   Procedure Laterality Date   APPENDECTOMY     CATARACT EXTRACTION Bilateral 2019   COLONOSCOPY  08/04/2015   internal and external hemorrhoids. Otherwise normal colonoscopy. No evidence of any active bleeding   ESOPHAGOGASTRODUODENOSCOPY  10/23/2012   Mild gastritis. Endoscopic finding suggestive of previous peptic ulcer disease    HEMORROIDECTOMY  1990   NASAL SEPTUM SURGERY  2015   REPLACEMENT TOTAL KNEE     ROTATOR CUFF REPAIR     shoul surg     TOTAL HIP ARTHROPLASTY Right 2019   hip and knee are replaced    TOTAL KNEE ARTHROPLASTY      Family History  Problem Relation Age of Onset   Other Mother        Viann Fish in a cotton mill   Diabetes Father    Heart disease Father    Pancreatic cancer Father    Heart disease Sister    Alzheimer's disease Sister    Colon cancer Neg Hx    Esophageal cancer Neg Hx     Social History   Tobacco Use   Smoking status: Former    Packs/day: 0.50    Years: 10.00    Pack years: 5.00    Types: Cigarettes    Quit date: 06/21/1968    Years since quitting: 52.6   Smokeless  tobacco: Never   Tobacco comments:    quit when you were 29   Vaping Use   Vaping Use: Never used  Substance Use Topics   Alcohol use: No   Drug use: No    Current Outpatient Medications  Medication Sig Dispense Refill   Acetaminophen (TYLENOL PO) Take 500 mg by mouth as needed.     amLODipine (NORVASC) 5 MG tablet TAKE 1 TABLET BY MOUTH EVERY DAY 90 tablet 2   aspirin EC 81 MG tablet Take 81 mg by mouth daily.     diphenhydrAMINE (BENADRYL) 25 mg capsule Take 25 mg by mouth 2 (two) times daily.     Ergocalciferol (VITAMIN D2 PO) Take 50,000 Units by mouth once a week. Only on Saturday's     famotidine (PEPCID) 20 MG tablet Take 20 mg by mouth at bedtime.     metoprolol succinate (TOPROL-XL) 100 MG 24 hr tablet Take 100 mg by mouth daily.     montelukast (SINGULAIR) 10 MG tablet Take 10 mg by mouth daily.  1   Multiple Vitamin (MULTIVITAMIN) capsule  Take 1 capsule by mouth daily.     niacin 500 MG tablet Take 500 mg by mouth at bedtime.     Omega-3 Fatty Acids (FISH OIL) 1200 MG CAPS Take 2 capsules by mouth 2 (two) times daily.     omeprazole (PRILOSEC) 40 MG capsule Take 1 capsule by mouth daily.  1   pravastatin (PRAVACHOL) 40 MG tablet Take 40 mg by mouth daily.  1   pyridoxine (B-6) 100 MG tablet Take 100 mg by mouth daily.     telmisartan (MICARDIS) 80 MG tablet Take 1 tablet (80 mg total) daily by mouth. 30 tablet 3   topiramate (TOPAMAX) 25 MG tablet TAKE 2 TABLETS BY MOUTH IN THE MORNING AND 1 IN THE AFTERNOON 90 tablet 0   No current facility-administered medications for this visit.    Allergies  Allergen Reactions   Contrast Media [Iodinated Diagnostic Agents] Rash   Erythromycin Rash   Sulpho-Lac Medicated Soap [Elemental Sulfur] Rash   Tetracyclines & Related Rash    Review of Systems:  neg     Physical Exam:    BP 138/76   Pulse (!) 58   Wt 151 lb 4 oz (68.6 kg)   SpO2 98%   BMI 27.66 kg/m  Filed Weights   01/23/21 1040  Weight: 151 lb 4 oz (68.6 kg)   Constitutional:  Well-developed, in no acute distress. Psychiatric: Normal mood and affect. Behavior is normal. HEENT: Pupils normal.  Conjunctivae are normal. No scleral icterus. Cardiovascular: Normal rate, regular rhythm. No edema Pulmonary/chest: Effort normal and breath sounds normal. No wheezing, rales or rhonchi. Abdominal: Soft, nondistended. Nontender. Bowel sounds active throughout. There are no masses palpable. No hepatomegaly. Rectal: Not performed today.  Will be performed at the time of colonoscopy. Neurological: Alert and oriented to person place and time. Skin: Skin is warm and dry. No rashes noted.  Data Reviewed: I have personally reviewed following labs and imaging studies  CBC: No flowsheet data found.  CMP: CMP Latest Ref Rng & Units 03/28/2017  Glucose 65 - 99 mg/dL 99  BUN 8 - 27 mg/dL 20  Creatinine 0.76 - 1.27 mg/dL 1.20   Sodium 134 - 144 mmol/L 142  Potassium 3.5 - 5.2 mmol/L 5.1  Chloride 96 - 106 mmol/L 106  CO2 20 - 29 mmol/L 20  Calcium 8.6 - 10.2 mg/dL 9.6     Carmell Austria,  MD 01/23/2021, 10:44 AM  Cc: Elenore Paddy, NP

## 2021-01-23 NOTE — Patient Instructions (Addendum)
If you are age 83 or older, your body mass index should be between 23-30. Your Body mass index is 27.66 kg/m. If this is out of the aforementioned range listed, please consider follow up with your Primary Care Provider.  If you are age 64 or younger, your body mass index should be between 19-25. Your Body mass index is 27.66 kg/m. If this is out of the aformentioned range listed, please consider follow up with your Primary Care Provider.   __________________________________________________________  The [facility] providers would like to encourage you to use [facility] to communicate with providers for non-urgent requests or questions.  Due to long hold times on the telephone, sending your provider a message by [facility] may be a faster and more efficient way to get a response.  Please allow 48 business hours for a response.  Please remember that this is for non-urgent requests.   You have been scheduled for a colonoscopy. Please follow written instructions given to you at your visit today.  Please pick up your prep supplies at the pharmacy within the next 1-3 days. If you use inhalers (even only as needed), please bring them with you on the day of your procedure.  Please go to the lab on the 2nd floor [address] before you leave the office today.   Continue current medication.  Please call with any questions or concerns.  Thank you,  Dr. Jackquline Denmark

## 2021-02-02 ENCOUNTER — Encounter: Payer: Self-pay | Admitting: Gastroenterology

## 2021-02-02 ENCOUNTER — Ambulatory Visit (AMBULATORY_SURGERY_CENTER): Payer: Medicare HMO | Admitting: Gastroenterology

## 2021-02-02 ENCOUNTER — Other Ambulatory Visit: Payer: Self-pay

## 2021-02-02 VITALS — BP 131/64 | HR 67 | Temp 97.3°F | Resp 17 | Ht 62.0 in | Wt 151.0 lb

## 2021-02-02 DIAGNOSIS — K625 Hemorrhage of anus and rectum: Secondary | ICD-10-CM

## 2021-02-02 DIAGNOSIS — K648 Other hemorrhoids: Secondary | ICD-10-CM | POA: Diagnosis not present

## 2021-02-02 DIAGNOSIS — D509 Iron deficiency anemia, unspecified: Secondary | ICD-10-CM

## 2021-02-02 DIAGNOSIS — K5730 Diverticulosis of large intestine without perforation or abscess without bleeding: Secondary | ICD-10-CM | POA: Diagnosis not present

## 2021-02-02 MED ORDER — SODIUM CHLORIDE 0.9 % IV SOLN: 500.0000 mL | Freq: Once | INTRAVENOUS | Status: DC

## 2021-02-02 MED ORDER — HYDROCORTISONE (PERIANAL) 2.5 % EX CREA: TOPICAL_CREAM | CUTANEOUS | 1 refills | Status: AC

## 2021-02-02 NOTE — Progress Notes (Signed)
To PACU, VSS. Report to [email or web address] 

## 2021-02-02 NOTE — Patient Instructions (Signed)
HANDOUTS ON DIVERTICULOSIS & HEMORRHOIDS GIVEN TO YOU TODAY  RX FOR HYDROCORTISONE RECTAL CREAM FOR HEMORRHOIDS SENT TO YOUR PHARMACY -[facility] [place]   YOU HAD AN ENDOSCOPIC PROCEDURE TODAY AT [place]:   Refer to the procedure report that was given to you for any specific questions about what was found during the examination.  If the procedure report does not answer your questions, please call your gastroenterologist to clarify.  If you requested that your care partner not be given the details of your procedure findings, then the procedure report has been included in a sealed envelope for you to review at your convenience later.  YOU SHOULD EXPECT: Some feelings of bloating in the abdomen. Passage of more gas than usual.  Walking can help get rid of the air that was put into your GI tract during the procedure and reduce the bloating. If you had a lower endoscopy (such as a colonoscopy or flexible sigmoidoscopy) you may notice spotting of blood in your stool or on the toilet paper. If you underwent a bowel prep for your procedure, you may not have a normal bowel movement for a few days.  Please Note:  You might notice some irritation and congestion in your nose or some drainage.  This is from the oxygen used during your procedure.  There is no need for concern and it should clear up in a day or so.  SYMPTOMS TO REPORT IMMEDIATELY:  Following lower endoscopy (colonoscopy or flexible sigmoidoscopy):  Excessive amounts of blood in the stool  Significant tenderness or worsening of abdominal pains  Swelling of the abdomen that is new, acute  Fever of 100F or higher   For urgent or emergent issues, a gastroenterologist can be reached at any hour by calling [phone]. Do not use MyChart messaging for urgent concerns.    DIET:  We do recommend a small meal at first, but then you may proceed to your regular diet.  Drink plenty of fluids but you should avoid alcoholic  beverages for 24 hours.  ACTIVITY:  You should plan to take it easy for the rest of today and you should NOT DRIVE or use heavy machinery until tomorrow (because of the sedation medicines used during the test).    FOLLOW UP: Our staff will call the number listed on your records 48-72 hours following your procedure to check on you and address any questions or concerns that you may have regarding the information given to you following your procedure. If we do not reach you, we will leave a message.  We will attempt to reach you two times.  During this call, we will ask if you have developed any symptoms of COVID 19. If you develop any symptoms (ie: fever, flu-like symptoms, shortness of breath, cough etc.) before then, please call [phone].  If you test positive for Covid 19 in the 2 weeks post procedure, please call and report this information to [place].    If any biopsies were taken you will be contacted by phone or by letter within the next 1-3 weeks.  Please call us at [phone] if you have not heard about the biopsies in 3 weeks.    SIGNATURES/CONFIDENTIALITY: You and/or your care partner have signed paperwork which will be entered into your electronic medical record.  These signatures attest to the fact that that the information above on your After Visit Summary has been reviewed and is understood.  Full responsibility of the confidentiality of this  discharge information lies with you and/or your care-partner.  

## 2021-02-02 NOTE — Op Note (Signed)
[facility] Patient Name: Scott Singh Procedure Date: 02/02/2021 9:46 AM MRN: [id number] Endoscopist: Jackquline Denmark , MD Age: 85 Referring MD:  Date of Birth: 01-Mar-1936 Gender: Male Account #: [id number] Procedure:                Colonoscopy Indications:              Rectal bleeding. H/O IDA Medicines:                Monitored Anesthesia Care Procedure:                Pre-Anesthesia Assessment:                           - Prior to the procedure, a History and Physical                            was performed, and patient medications and                            allergies were reviewed. The patient's tolerance of                            previous anesthesia was also reviewed. The risks                            and benefits of the procedure and the sedation                            options and risks were discussed with the patient.                            All questions were answered, and informed consent                            was obtained. Prior Anticoagulants: The patient has                            taken no previous anticoagulant or antiplatelet                            agents. ASA Grade Assessment: II - A patient with                            mild systemic disease. After reviewing the risks                            and benefits, the patient was deemed in                            satisfactory condition to undergo the procedure.                           After obtaining informed consent, the colonoscope  was passed under direct vision. Throughout the                            procedure, the patient's blood pressure, pulse, and                            oxygen saturations were monitored continuously. The                            CF [id number] was introduced through the anus                            and advanced to the 1 cm into the ileum. The                            colonoscopy was performed without difficulty.  The                            patient tolerated the procedure well. The quality                            of the bowel preparation was good. The terminal                            ileum, ileocecal valve, appendiceal orifice, and                            rectum were photographed. Scope In: 9:55:53 AM Scope Out: 10:07:45 AM Scope Withdrawal Time: 0 hours 7 minutes 18 seconds  Total Procedure Duration: 0 hours 11 minutes 52 seconds  Findings:                 Multiple medium-mouthed diverticula were found in                            the sigmoid colon, few in transverse colon and                            ascending colon.                           Non-bleeding internal hemorrhoids were found during                            retroflexion and during perianal exam. The                            hemorrhoids were moderate. There is also previous                            evidence of hemorrhoidectomy.                           The terminal ileum appeared normal.  The exam was otherwise without abnormality on                            direct and retroflexion views. Complications:            No immediate complications. Estimated Blood Loss:     Estimated blood loss: none. Impression:               - Pancolonic diverticulosis predominantly in the                            sigmoid colon.                           - Non-bleeding internal hemorrhoids.                           - The examined portion of the ileum was normal.                           - The examination was otherwise normal on direct                            and retroflexion views.                           - No specimens collected. Recommendation:           - Patient has a contact number available for                            emergencies. The signs and symptoms of potential                            delayed complications were discussed with the                            patient. Return to  normal activities tomorrow.                            Written discharge instructions were provided to the                            patient.                           - High fiber diet.                           - Continue present medications.                           - Use HC Cream 2.5%: Apply externally BID for 10                            days, then as needed. If still with problems, would  consider hemorrhoidal banding                           - The findings and recommendations were discussed                            with the patient's family. Jackquline Denmark, MD 02/02/2021 10:12:53 AM This report has been signed electronically.

## 2021-02-02 NOTE — Progress Notes (Signed)
Chief Complaint: FU  Referring Provider:  Elenore Paddy, NP      ASSESSMENT AND PLAN;   #1. Rectal bleeding, likely d/t Int hoids. R/O other causes.  Neg colon 07/2015 except for hemorrhoids. CT urogram showing rectal wall thickening 10/2020.  #2.  H/O hemorrhoidectomy 1990s  #3. IDA. Hb 12.4 MCV 87 09/2020.  Plan: Colon today   Apporiate for [facility]  HPI:    Scott Singh is a 85 y.o. male  For follow-up visit Still with rectal problems Has been having intermittent rectal bleeding.  He has been advised to get colonoscopy performed. NCCT as above showed rectal wall thickening 10/2020 Occ constipation but doing better with stool softner  Went to [place] to visit his family.  Went fishing and boat got stuck, 1[place]/2 miles [place].  Had to be [place]  No fever chills or night sweats.  No recent weight loss.  Denies having any upper GI symptoms including nausea, vomiting, heartburn, odynophagia or dysphagia.  Physically very active.  Most recent hemoglobin was 12.4 with MCV 87 on 10/15/2020.  His creatinine was 1.3 (normal).  Iron studies showed iron saturation 11%, iron 33 and TIBC 303.  CT urogram 10/2020: -Thickening of rectum ?procititis Past Medical History:  Diagnosis Date   Asthma    BPH (benign prostatic hyperplasia)    Carpal tunnel syndrome    CKD (chronic kidney disease) stage 2, GFR 60-89 ml/min    Depression    GERD (gastroesophageal reflux disease)    History of TIA (transient ischemic attack)    mini stroke brain stem   Hyperlipidemia    Hypertension    Insomnia    Migraines    Prediabetes    Recurrent major depression (HCC)    Shortness of breath    Sleep apnea     Past Surgical History:  Procedure Laterality Date   APPENDECTOMY     CATARACT EXTRACTION Bilateral 2019   COLONOSCOPY  08/04/2015   internal and external hemorrhoids. Otherwise normal colonoscopy. No evidence of any active bleeding    ESOPHAGOGASTRODUODENOSCOPY  10/23/2012   Mild gastritis. Endoscopic finding suggestive of previous peptic ulcer disease    HEMORROIDECTOMY  1990   NASAL SEPTUM SURGERY  2015   REPLACEMENT TOTAL KNEE     ROTATOR CUFF REPAIR     shoul surg     TOTAL HIP ARTHROPLASTY Right 2019   hip and knee are replaced    TOTAL KNEE ARTHROPLASTY      Family History  Problem Relation Age of Onset   Other Mother        Viann Fish in a cotton mill   Diabetes Father    Heart disease Father    Pancreatic cancer Father    Heart disease Sister    Alzheimer's disease Sister    Colon cancer Neg Hx    Esophageal cancer Neg Hx    Rectal cancer Neg Hx    Stomach cancer Neg Hx     Social History   Tobacco Use   Smoking status: Former    Packs/day: 0.50    Years: 10.00    Pack years: 5.00    Types: Cigarettes    Quit date: 06/21/1968    Years since quitting: 52.6   Smokeless tobacco: Never   Tobacco comments:    quit when you were 29   Vaping Use   Vaping Use: Never used  Substance Use Topics   Alcohol use:  No   Drug use: No    Current Outpatient Medications  Medication Sig Dispense Refill   amLODipine (NORVASC) 5 MG tablet TAKE 1 TABLET BY MOUTH EVERY DAY 90 tablet 2   aspirin EC 81 MG tablet Take 81 mg by mouth daily.     Ergocalciferol (VITAMIN D2 PO) Take 50,000 Units by mouth once a week. Only on Saturday's     famotidine (PEPCID) 20 MG tablet Take 20 mg by mouth at bedtime.     metoprolol succinate (TOPROL-XL) 100 MG 24 hr tablet Take 100 mg by mouth daily.     montelukast (SINGULAIR) 10 MG tablet Take 10 mg by mouth daily.  1   Multiple Vitamin (MULTIVITAMIN) capsule Take 1 capsule by mouth daily.     niacin 500 MG tablet Take 500 mg by mouth at bedtime.     Omega-3 Fatty Acids (FISH OIL) 1200 MG CAPS Take 2 capsules by mouth 2 (two) times daily.     omeprazole (PRILOSEC) 40 MG capsule Take 1 capsule by mouth daily.  1   pravastatin (PRAVACHOL) 40 MG tablet Take 40 mg by mouth  daily.  1   pyridoxine (B-6) 100 MG tablet Take 100 mg by mouth daily.     telmisartan (MICARDIS) 80 MG tablet Take 1 tablet (80 mg total) daily by mouth. 30 tablet 3   topiramate (TOPAMAX) 25 MG tablet TAKE 2 TABLETS BY MOUTH IN THE MORNING AND 1 IN THE AFTERNOON 90 tablet 0   Acetaminophen (TYLENOL PO) Take 500 mg by mouth as needed.     diphenhydrAMINE (BENADRYL) 25 mg capsule Take 25 mg by mouth 2 (two) times daily.     Current Facility-Administered Medications  Medication Dose Route Frequency Provider Last Rate Last Admin   0.9 %  sodium chloride infusion  500 mL Intravenous Once Jackquline Denmark, MD        Allergies  Allergen Reactions   Contrast Media [Iodinated Diagnostic Agents] Rash   Erythromycin Rash   Sulpho-Lac Medicated Soap [Elemental Sulfur] Rash   Tetracyclines & Related Rash    Review of Systems:  neg     Physical Exam:    BP (!) 130/51   Pulse 78   Temp (!) 97.3 F (36.3 C)   Resp 17   Ht '5\' 2"'$  (1.575 m)   Wt 151 lb (68.5 kg)   SpO2 98%   BMI 27.62 kg/m  Filed Weights   02/02/21 0912  Weight: 151 lb (68.5 kg)   Constitutional:  Well-developed, in no acute distress. Psychiatric: Normal mood and affect. Behavior is normal. HEENT: Pupils normal.  Conjunctivae are normal. No scleral icterus. Cardiovascular: Normal rate, regular rhythm. No edema Pulmonary/chest: Effort normal and breath sounds normal. No wheezing, rales or rhonchi. Abdominal: Soft, nondistended. Nontender. Bowel sounds active throughout. There are no masses palpable. No hepatomegaly. Rectal: Not performed today.  Will be performed at the time of colonoscopy. Neurological: Alert and oriented to person place and time. Skin: Skin is warm and dry. No rashes noted.  Data Reviewed: I have personally reviewed following labs and imaging studies  CBC: CBC Latest Ref Rng & Units 01/23/2021  WBC 4.0 - 10.5 K/uL 7.8  Hemoglobin 13.0 - 17.0 g/dL 13.1  Hematocrit 39.0 - 52.0 % 39.7  Platelets  150.0 - 400.0 K/uL 180.0    CMP: CMP Latest Ref Rng & Units 01/23/2021 03/28/2017  Glucose 70 - 99 mg/dL 94 99  BUN 6 - 23 mg/dL 21 20  Creatinine 0.40 -  1.50 mg/dL 1.27 1.20  Sodium 135 - 145 mEq/L 138 142  Potassium 3.5 - 5.1 mEq/L 5.1 5.1  Chloride 96 - 112 mEq/L 106 106  CO2 19 - 32 mEq/L 27 20  Calcium 8.4 - 10.5 mg/dL 10.0 9.6  Total Protein 6.0 - 8.3 g/dL 7.5 -  Total Bilirubin 0.2 - 1.2 mg/dL 0.4 -  Alkaline Phos 39 - 117 U/L 69 -  AST 0 - 37 U/L 21 -  ALT 0 - 53 U/L 17 -     Carmell Austria, MD 02/02/2021, 9:50 AM  Cc: Elenore Paddy, NP

## 2021-02-02 NOTE — Progress Notes (Signed)
VS taken by DT 

## 2021-02-04 ENCOUNTER — Telehealth: Payer: Self-pay

## 2021-02-04 NOTE — Telephone Encounter (Signed)
  Follow up Call-  Call back number 02/02/2021  Post procedure Call Back phone  # [phone]  Permission to leave phone message Yes  Some recent data might be hidden     Patient questions:  Do you have a fever, pain , or abdominal swelling? No. Pain Score  0 *  Have you tolerated food without any problems? Yes.    Have you been able to return to your normal activities? Yes.    Do you have any questions about your discharge instructions: Diet   No. Medications  No. Follow up visit  No.  Do you have questions or concerns about your Care? No.  Actions: * If pain score is 4 or above: No action needed, pain <4.  Have you developed a fever since your procedure? no  2.   Have you had an respiratory symptoms (SOB or cough) since your procedure? no  3.   Have you tested positive for COVID 19 since your procedure nop  4.   Have you had any family members/close contacts diagnosed with the COVID 19 since your procedure?  no   If yes to any of these questions please route to Joylene John, RN and Joella Prince, RN

## 2021-04-13 ENCOUNTER — Other Ambulatory Visit: Payer: Self-pay | Admitting: Cardiology

## 2021-04-13 NOTE — Telephone Encounter (Signed)
Called and spoke with patient and his wife letting them know we needed to schedule an appointment for the patient to see Dr. Bettina Gavia because the patient has not been seen since 2020.  Patient's wife informs me his PCP prescribes this medication because there insurance copay is to high for the patient to come into the office to see Dr. Bettina Gavia.  I informed the wife I would deny the refill and she would need to call the pharmacy and have them resend a refill request to the patient's PCP.

## 2021-05-16 ENCOUNTER — Other Ambulatory Visit: Payer: Self-pay | Admitting: Cardiology

## 2021-05-16 DIAGNOSIS — R0602 Shortness of breath: Secondary | ICD-10-CM

## 2021-06-01 DIAGNOSIS — Z6827 Body mass index (BMI) 27.0-27.9, adult: Secondary | ICD-10-CM | POA: Diagnosis not present

## 2021-06-01 DIAGNOSIS — M720 Palmar fascial fibromatosis [Dupuytren]: Secondary | ICD-10-CM | POA: Diagnosis not present

## 2021-06-01 DIAGNOSIS — R799 Abnormal finding of blood chemistry, unspecified: Secondary | ICD-10-CM | POA: Diagnosis not present

## 2021-06-01 DIAGNOSIS — M65332 Trigger finger, left middle finger: Secondary | ICD-10-CM | POA: Diagnosis not present

## 2021-06-10 DIAGNOSIS — M65332 Trigger finger, left middle finger: Secondary | ICD-10-CM | POA: Diagnosis not present

## 2021-06-10 DIAGNOSIS — M67432 Ganglion, left wrist: Secondary | ICD-10-CM | POA: Diagnosis not present

## 2021-06-10 DIAGNOSIS — M65341 Trigger finger, right ring finger: Secondary | ICD-10-CM | POA: Diagnosis not present

## 2021-06-10 DIAGNOSIS — G5601 Carpal tunnel syndrome, right upper limb: Secondary | ICD-10-CM | POA: Diagnosis not present

## 2021-06-10 DIAGNOSIS — M79642 Pain in left hand: Secondary | ICD-10-CM | POA: Diagnosis not present

## 2021-06-10 DIAGNOSIS — M79641 Pain in right hand: Secondary | ICD-10-CM | POA: Diagnosis not present

## 2021-06-16 DIAGNOSIS — I509 Heart failure, unspecified: Secondary | ICD-10-CM | POA: Diagnosis not present

## 2021-06-16 DIAGNOSIS — Z6827 Body mass index (BMI) 27.0-27.9, adult: Secondary | ICD-10-CM | POA: Diagnosis not present

## 2021-06-16 DIAGNOSIS — N183 Chronic kidney disease, stage 3 unspecified: Secondary | ICD-10-CM | POA: Diagnosis not present

## 2021-06-16 DIAGNOSIS — I1310 Hypertensive heart and chronic kidney disease without heart failure, with stage 1 through stage 4 chronic kidney disease, or unspecified chronic kidney disease: Secondary | ICD-10-CM | POA: Diagnosis not present

## 2021-06-16 DIAGNOSIS — R079 Chest pain, unspecified: Secondary | ICD-10-CM | POA: Diagnosis not present

## 2021-06-26 DIAGNOSIS — E782 Mixed hyperlipidemia: Secondary | ICD-10-CM | POA: Diagnosis not present

## 2021-06-26 DIAGNOSIS — I10 Essential (primary) hypertension: Secondary | ICD-10-CM | POA: Diagnosis not present

## 2021-06-26 DIAGNOSIS — Z7982 Long term (current) use of aspirin: Secondary | ICD-10-CM | POA: Diagnosis not present

## 2021-06-26 DIAGNOSIS — Z87891 Personal history of nicotine dependence: Secondary | ICD-10-CM | POA: Diagnosis not present

## 2021-06-26 DIAGNOSIS — R0609 Other forms of dyspnea: Secondary | ICD-10-CM | POA: Diagnosis not present

## 2021-06-26 DIAGNOSIS — R079 Chest pain, unspecified: Secondary | ICD-10-CM | POA: Diagnosis not present

## 2021-07-07 DIAGNOSIS — R079 Chest pain, unspecified: Secondary | ICD-10-CM | POA: Diagnosis not present

## 2021-07-07 DIAGNOSIS — R0609 Other forms of dyspnea: Secondary | ICD-10-CM | POA: Diagnosis not present

## 2021-07-07 DIAGNOSIS — R0602 Shortness of breath: Secondary | ICD-10-CM | POA: Diagnosis not present

## 2021-09-10 DIAGNOSIS — L570 Actinic keratosis: Secondary | ICD-10-CM | POA: Diagnosis not present

## 2021-09-10 DIAGNOSIS — C44229 Squamous cell carcinoma of skin of left ear and external auricular canal: Secondary | ICD-10-CM | POA: Diagnosis not present

## 2021-09-10 DIAGNOSIS — K130 Diseases of lips: Secondary | ICD-10-CM | POA: Diagnosis not present

## 2021-09-10 DIAGNOSIS — L304 Erythema intertrigo: Secondary | ICD-10-CM | POA: Diagnosis not present

## 2021-09-15 DIAGNOSIS — Z7982 Long term (current) use of aspirin: Secondary | ICD-10-CM | POA: Diagnosis not present

## 2021-09-15 DIAGNOSIS — I10 Essential (primary) hypertension: Secondary | ICD-10-CM | POA: Diagnosis not present

## 2021-09-15 DIAGNOSIS — R079 Chest pain, unspecified: Secondary | ICD-10-CM | POA: Diagnosis not present

## 2021-09-15 DIAGNOSIS — E782 Mixed hyperlipidemia: Secondary | ICD-10-CM | POA: Diagnosis not present

## 2021-09-15 DIAGNOSIS — Z87891 Personal history of nicotine dependence: Secondary | ICD-10-CM | POA: Diagnosis not present

## 2021-09-19 DIAGNOSIS — Z20828 Contact with and (suspected) exposure to other viral communicable diseases: Secondary | ICD-10-CM | POA: Diagnosis not present

## 2021-09-19 DIAGNOSIS — R051 Acute cough: Secondary | ICD-10-CM | POA: Diagnosis not present

## 2021-09-19 DIAGNOSIS — R509 Fever, unspecified: Secondary | ICD-10-CM | POA: Diagnosis not present

## 2021-09-19 DIAGNOSIS — R5381 Other malaise: Secondary | ICD-10-CM | POA: Diagnosis not present

## 2021-09-19 DIAGNOSIS — M791 Myalgia, unspecified site: Secondary | ICD-10-CM | POA: Diagnosis not present

## 2021-10-06 DIAGNOSIS — C44229 Squamous cell carcinoma of skin of left ear and external auricular canal: Secondary | ICD-10-CM | POA: Diagnosis not present

## 2021-12-23 DIAGNOSIS — I1310 Hypertensive heart and chronic kidney disease without heart failure, with stage 1 through stage 4 chronic kidney disease, or unspecified chronic kidney disease: Secondary | ICD-10-CM | POA: Diagnosis not present

## 2021-12-23 DIAGNOSIS — R059 Cough, unspecified: Secondary | ICD-10-CM | POA: Diagnosis not present

## 2021-12-23 DIAGNOSIS — Z0000 Encounter for general adult medical examination without abnormal findings: Secondary | ICD-10-CM | POA: Diagnosis not present

## 2021-12-23 DIAGNOSIS — Z1331 Encounter for screening for depression: Secondary | ICD-10-CM | POA: Diagnosis not present

## 2021-12-23 DIAGNOSIS — E559 Vitamin D deficiency, unspecified: Secondary | ICD-10-CM | POA: Diagnosis not present

## 2021-12-23 DIAGNOSIS — N183 Chronic kidney disease, stage 3 unspecified: Secondary | ICD-10-CM | POA: Diagnosis not present

## 2021-12-23 DIAGNOSIS — Z6827 Body mass index (BMI) 27.0-27.9, adult: Secondary | ICD-10-CM | POA: Diagnosis not present

## 2021-12-23 DIAGNOSIS — L309 Dermatitis, unspecified: Secondary | ICD-10-CM | POA: Diagnosis not present

## 2021-12-23 DIAGNOSIS — Z79899 Other long term (current) drug therapy: Secondary | ICD-10-CM | POA: Diagnosis not present

## 2021-12-23 DIAGNOSIS — E785 Hyperlipidemia, unspecified: Secondary | ICD-10-CM | POA: Diagnosis not present

## 2022-02-18 DIAGNOSIS — I10 Essential (primary) hypertension: Secondary | ICD-10-CM | POA: Diagnosis not present

## 2022-02-18 DIAGNOSIS — E785 Hyperlipidemia, unspecified: Secondary | ICD-10-CM | POA: Diagnosis not present

## 2022-02-23 DIAGNOSIS — Z961 Presence of intraocular lens: Secondary | ICD-10-CM | POA: Diagnosis not present

## 2022-02-23 DIAGNOSIS — D492 Neoplasm of unspecified behavior of bone, soft tissue, and skin: Secondary | ICD-10-CM | POA: Diagnosis not present

## 2022-02-23 DIAGNOSIS — H26491 Other secondary cataract, right eye: Secondary | ICD-10-CM | POA: Diagnosis not present

## 2022-04-09 DIAGNOSIS — C44229 Squamous cell carcinoma of skin of left ear and external auricular canal: Secondary | ICD-10-CM | POA: Diagnosis not present

## 2022-05-26 DIAGNOSIS — M65342 Trigger finger, left ring finger: Secondary | ICD-10-CM | POA: Diagnosis not present

## 2022-05-26 DIAGNOSIS — M65332 Trigger finger, left middle finger: Secondary | ICD-10-CM | POA: Diagnosis not present

## 2022-05-26 DIAGNOSIS — G5601 Carpal tunnel syndrome, right upper limb: Secondary | ICD-10-CM | POA: Diagnosis not present

## 2022-07-13 DIAGNOSIS — I10 Essential (primary) hypertension: Secondary | ICD-10-CM | POA: Diagnosis not present

## 2022-07-13 DIAGNOSIS — R0609 Other forms of dyspnea: Secondary | ICD-10-CM | POA: Diagnosis not present

## 2022-07-13 DIAGNOSIS — R052 Subacute cough: Secondary | ICD-10-CM | POA: Diagnosis not present

## 2022-07-13 DIAGNOSIS — E782 Mixed hyperlipidemia: Secondary | ICD-10-CM | POA: Diagnosis not present

## 2022-07-14 DIAGNOSIS — I10 Essential (primary) hypertension: Secondary | ICD-10-CM | POA: Diagnosis not present

## 2022-07-14 DIAGNOSIS — R002 Palpitations: Secondary | ICD-10-CM | POA: Diagnosis not present

## 2022-07-21 DIAGNOSIS — R052 Subacute cough: Secondary | ICD-10-CM | POA: Diagnosis not present

## 2022-07-21 DIAGNOSIS — E782 Mixed hyperlipidemia: Secondary | ICD-10-CM | POA: Diagnosis not present

## 2022-07-21 DIAGNOSIS — I10 Essential (primary) hypertension: Secondary | ICD-10-CM | POA: Diagnosis not present

## 2022-07-21 DIAGNOSIS — R0609 Other forms of dyspnea: Secondary | ICD-10-CM | POA: Diagnosis not present
